# Patient Record
Sex: Female | Born: 1975 | Race: White | Hispanic: No | Marital: Married | Smoking: Never smoker
Health system: Southern US, Community
[De-identification: ages and names within clinical notes are randomized; demographics above are authoritative.]

## PROBLEM LIST (undated history)

## (undated) DIAGNOSIS — F32A Depression, unspecified: Secondary | ICD-10-CM

## (undated) DIAGNOSIS — N83209 Unspecified ovarian cyst, unspecified side: Secondary | ICD-10-CM

## (undated) DIAGNOSIS — F419 Anxiety disorder, unspecified: Secondary | ICD-10-CM

## (undated) DIAGNOSIS — D649 Anemia, unspecified: Secondary | ICD-10-CM

## (undated) HISTORY — PX: OTHER SURGICAL HISTORY: SHX169

---

## 2006-04-03 ENCOUNTER — Emergency Department: Payer: Self-pay | Admitting: Emergency Medicine

## 2006-04-03 ENCOUNTER — Other Ambulatory Visit: Payer: Self-pay

## 2006-05-27 ENCOUNTER — Inpatient Hospital Stay: Payer: Self-pay

## 2006-05-29 ENCOUNTER — Other Ambulatory Visit: Payer: Self-pay

## 2006-06-21 ENCOUNTER — Inpatient Hospital Stay: Payer: Self-pay | Admitting: Unknown Physician Specialty

## 2006-06-22 ENCOUNTER — Other Ambulatory Visit: Payer: Self-pay

## 2006-07-01 ENCOUNTER — Other Ambulatory Visit: Payer: Self-pay

## 2006-07-15 ENCOUNTER — Ambulatory Visit: Payer: Self-pay | Admitting: Unknown Physician Specialty

## 2006-07-29 ENCOUNTER — Ambulatory Visit: Payer: Self-pay | Admitting: Unknown Physician Specialty

## 2006-08-29 ENCOUNTER — Ambulatory Visit: Payer: Self-pay | Admitting: Unknown Physician Specialty

## 2006-09-27 ENCOUNTER — Ambulatory Visit: Payer: Self-pay | Admitting: Unknown Physician Specialty

## 2006-10-28 ENCOUNTER — Ambulatory Visit: Payer: Self-pay | Admitting: Unknown Physician Specialty

## 2006-11-27 ENCOUNTER — Ambulatory Visit: Payer: Self-pay | Admitting: Unknown Physician Specialty

## 2006-12-28 ENCOUNTER — Ambulatory Visit: Payer: Self-pay | Admitting: Unknown Physician Specialty

## 2007-01-27 ENCOUNTER — Ambulatory Visit: Payer: Self-pay | Admitting: Unknown Physician Specialty

## 2007-02-27 ENCOUNTER — Ambulatory Visit: Payer: Self-pay | Admitting: Unknown Physician Specialty

## 2007-03-30 ENCOUNTER — Ambulatory Visit: Payer: Self-pay | Admitting: Unknown Physician Specialty

## 2007-04-29 ENCOUNTER — Ambulatory Visit: Payer: Self-pay | Admitting: Unknown Physician Specialty

## 2007-05-30 ENCOUNTER — Ambulatory Visit: Payer: Self-pay | Admitting: Unknown Physician Specialty

## 2007-06-29 ENCOUNTER — Ambulatory Visit: Payer: Self-pay | Admitting: Unknown Physician Specialty

## 2007-07-30 ENCOUNTER — Ambulatory Visit: Payer: Self-pay | Admitting: Unknown Physician Specialty

## 2007-08-30 ENCOUNTER — Ambulatory Visit: Payer: Self-pay | Admitting: Unknown Physician Specialty

## 2007-09-29 ENCOUNTER — Ambulatory Visit: Payer: Self-pay | Admitting: Unknown Physician Specialty

## 2007-10-28 ENCOUNTER — Ambulatory Visit: Payer: Self-pay | Admitting: Unknown Physician Specialty

## 2007-11-27 ENCOUNTER — Ambulatory Visit: Payer: Self-pay | Admitting: Unknown Physician Specialty

## 2007-12-28 ENCOUNTER — Ambulatory Visit: Payer: Self-pay | Admitting: Unknown Physician Specialty

## 2008-01-27 ENCOUNTER — Ambulatory Visit: Payer: Self-pay | Admitting: Unknown Physician Specialty

## 2008-03-29 ENCOUNTER — Ambulatory Visit: Payer: Self-pay | Admitting: Unknown Physician Specialty

## 2008-06-28 ENCOUNTER — Ambulatory Visit: Payer: Self-pay | Admitting: Unknown Physician Specialty

## 2008-07-29 ENCOUNTER — Ambulatory Visit: Payer: Self-pay | Admitting: Unknown Physician Specialty

## 2008-09-26 ENCOUNTER — Ambulatory Visit: Payer: Self-pay | Admitting: Unknown Physician Specialty

## 2008-11-26 ENCOUNTER — Ambulatory Visit: Payer: Self-pay | Admitting: Unknown Physician Specialty

## 2009-02-26 ENCOUNTER — Ambulatory Visit: Payer: Self-pay | Admitting: Unknown Physician Specialty

## 2019-09-20 ENCOUNTER — Other Ambulatory Visit: Payer: Self-pay | Admitting: Internal Medicine

## 2019-09-20 DIAGNOSIS — Z1231 Encounter for screening mammogram for malignant neoplasm of breast: Secondary | ICD-10-CM

## 2019-12-13 ENCOUNTER — Ambulatory Visit
Admission: RE | Admit: 2019-12-13 | Discharge: 2019-12-13 | Disposition: A | Discharge status: Home / Self Care | Payer: BC Managed Care – PPO | Source: Ambulatory Visit | Attending: Internal Medicine | Admitting: Internal Medicine

## 2019-12-13 DIAGNOSIS — Z1231 Encounter for screening mammogram for malignant neoplasm of breast: Secondary | ICD-10-CM | POA: Diagnosis not present

## 2019-12-15 ENCOUNTER — Other Ambulatory Visit: Payer: Self-pay | Admitting: Internal Medicine

## 2019-12-15 DIAGNOSIS — N6320 Unspecified lump in the left breast, unspecified quadrant: Secondary | ICD-10-CM

## 2019-12-15 DIAGNOSIS — R928 Other abnormal and inconclusive findings on diagnostic imaging of breast: Secondary | ICD-10-CM

## 2019-12-23 ENCOUNTER — Ambulatory Visit
Admission: RE | Admit: 2019-12-23 | Discharge: 2019-12-23 | Disposition: A | Discharge status: Home / Self Care | Payer: BC Managed Care – PPO | Source: Ambulatory Visit | Attending: Internal Medicine | Admitting: Internal Medicine

## 2019-12-23 DIAGNOSIS — N6320 Unspecified lump in the left breast, unspecified quadrant: Secondary | ICD-10-CM | POA: Insufficient documentation

## 2019-12-23 DIAGNOSIS — R928 Other abnormal and inconclusive findings on diagnostic imaging of breast: Secondary | ICD-10-CM

## 2019-12-24 ENCOUNTER — Other Ambulatory Visit: Payer: Self-pay | Admitting: Internal Medicine

## 2019-12-24 DIAGNOSIS — N6320 Unspecified lump in the left breast, unspecified quadrant: Secondary | ICD-10-CM

## 2020-05-10 ENCOUNTER — Other Ambulatory Visit: Payer: BC Managed Care – PPO

## 2020-05-10 DIAGNOSIS — Z20822 Contact with and (suspected) exposure to covid-19: Secondary | ICD-10-CM

## 2020-05-11 LAB — NOVEL CORONAVIRUS, NAA: SARS-CoV-2, NAA: NOT DETECTED

## 2020-05-11 LAB — SARS-COV-2, NAA 2 DAY TAT

## 2020-09-20 ENCOUNTER — Other Ambulatory Visit: Payer: Self-pay | Admitting: Obstetrics and Gynecology

## 2020-10-19 NOTE — H&P (Signed)
Leslie Elliott is a 45 y.o. female here for pre-op sign consent for Robotically assisted Laparoscopic Ovarian Cystectomy on 10/30/20.  Referring provider: Genice Rouge*  History of Present Illness: Patient is an established patient who presents for a pre-op sign consent for a robotic-assisted laparoscopic ovarian cystectomy on 10/30/20. Patient has a hx of multiple simple and complex ovarian cysts.  In 03/2019, patient had experienced intermenstrual spotting and a few episodes of menorrhagia for which she had work up with pelvic ultrasound, see below. TVUS in 03/2019 revealed 3 simple ovarian cysts of the left ovary and 4 bilateral complex ovarian cysts.Imaging also revealed thickened endometrium, for which endometrial biopsy was done, results below.  Work up so far: Incidental finding upon AUB work up in 2020 (new onset intermenstrual spotting, menorrhagia): -EMBx:Secretory endometrium, day 20-21, post-ovulatory day 6-7. o hyperplasia, carcinoma or endometritis. -TVUS 03/2019:Utanteverted:10.45 x 5.94 x 6.51cm;Fibroids: 1)endometrial=1.5cm 2)anterior=0.79cm 3)Rt fundal=2.1cm 4)fundal=3.2cm; Endometrium:17.66mm LO:4 cysts: 1)simple=1.3cm2)complex=1.5cm; solid area within=1.01 x 0.33 x 1.03cm3)simple=1.4cm 4)simple=1.2cm RO:3 cysts: 1)complex=1.5cm 2)complex=2.3cm; septation=0.19cm 3)complex=2.6cm; solid area within=1.39 x 0.87 x 1.19cm  02/2020: CA125: normal TVUS 02/2020: Ut anteverted=10.95 x 5.89 x 6.95cm;Fibroids seen: 1)endometrial = 1.6cm         2) anterior = 1.2cm 3) rt fundal = 2.1cm 4) funda l= 3.1cm Endometrium=18.52mm;Free fluid seen in PCDS Lt ovary contains 3 cysts: 1)simple=2.6cm 2)simple=1.3cm 3)complex with solid calcified nodule within=1.8cm; nodule=1.19 x 0.45 x 1.14cm Rt ovary contains 2 cysts: 1)corpus luteum=1.5cm 2)complex with solid nodule within=3cm; nodule=1.39 x 0.78 x  1.31cm  09/14/20: Uterusanteverted=10.51 x 5.23 x 6.02cm;Endometrium=14.45mm Fibroids seen:   1) Rt lateral cervix=1.5cm 2) endometrial=1.6cm              3) anterior=1.2cm   4) Rt fundal=1.6cm   5) fundal=3.9cm Rt ovary contains 3cysts:1) simple=2.8cm 2) complex with solid nodule within = 2.7cm; nodule = 1.23 x 1.01 x 1.16cm 3) simple paraovarian = 1.6cm ROvolume = 18.76ml LOcontains 3cysts:1) simple = 2.3cm 2) simple = 1.3cm                                    3) complex with solid calcified nodule within = 1.8cm; nodule = 1.05 x 0.40   x 0.90cm No free fluid seen  Pertinent Hx: -2021 annual: periods\pretty normal now -3 SVD; s/p BTL -Mother had uterine cancer, dx at age 90 -Menses PJ:ASNKNLZ, 4-6 weeks when younger but becameq4 weeks  -Irregular bleeding episode 03/2019:  -Uterine Fibroids, Menorrhagiain8/2021 -Imaging showed 4 uterine fibroids ranging in size from 1.6cm to 3cm. Pt is           symptomatic w/ menorrhagia but not bothered -Hx of cervical polyp, bx done 2020: benign endocervical polyp  Past Medical History:  has a past medical history of Anxiety and Depression.  Past Surgical History:  has a past surgical history that includes Tubal ligation (2002). Family History: family history includes Allergies in her mother; Cancer in her mother; Heart disease in her father; Hyperlipidemia (Elevated cholesterol) in her mother. Social History:  reports that she has never smoked. She has never used smokeless tobacco. She reports current alcohol use. She reports that she does not use drugs. OB/GYN History:          OB History    Gravida  3   Para  3   Term  3   Preterm      AB      Living  3     SAB      IAB      Ectopic      Molar      Multiple      Live Births            Allergies: has No Known Allergies. Medications: Current Outpatient Medications:  .  ARIPiprazole (ABILIFY) 5 MG tablet, ,  Disp: , Rfl:  .  buPROPion (WELLBUTRIN SR) 150 MG SR tablet, , Disp: , Rfl:  .  cyanocobalamin (VITAMIN B12) 1000 MCG tablet, Take by mouth, Disp: , Rfl:  .  ferrous sulfate 325 (65 FE) MG EC tablet, Take by mouth, Disp: , Rfl:  .  ibuprofen (MOTRIN) 200 MG tablet, Take by mouth, Disp: , Rfl:  .  propranolol (INDERAL) 10 MG tablet, Take 10 mg by mouth 2 (two) times daily, Disp: , Rfl:   Review of Systems: No SOB, no palpitations or chest pain, no new lower extremity edema, no nausea or vomiting or bowel or bladder complaints. See HPI for gyn specific ROS.   Exam:      Vitals:   10/19/20 1134  BP: 103/72  Pulse: 92    Lungs: CTA  CV : RRR without murmur   Skin: No rashes, ulcers or skin lesions noted. No excessive hirsutism or acne noted.                                                  Neurological: Appears alert and oriented and is a good historian. No gross abnormalities are noted.                     Psychological: Normal affect and mood. No signs of anxiety or depression noted.                                                           Abdomen: Soft , no mass, normal active bowel sounds,  non-tender, no rebound tenderness       Pelvic:  deferred  Impression:   The encounter diagnosis was Complex cyst of both ovaries.  Plan:   Patient returns for a preoperative discussion regarding her plans to proceed with surgical treatment of her bilateral ovarian cysts by robotically assisted bilateral ovarian cystectomy procedure. The patient and I discussed the technical aspects of the procedure including the potential for risks and complications. These include but are not limited to the risk of infection requiring post-operative antibiotics or further procedures. We talked about the risk of injury to adjacent organs including bladder, bowel, ureter, blood vessels or nerves. We talked about the need to convert to an open incision. We talked about the possible need for blood  transfusion. We talked aboutpostop complications such asthromboembolic or cardiopulmonary complications. All of her questions were answered.  Her preoperative exam was completed and the appropriate consents were signed. She is scheduled to undergo this procedure in the near future.  Specific Peri-operative Considerations:  - Consent: obtained today - Health Maintenance: up to date - Labs: CBC, CMP preoperatively - Studies: EKG, CXR preoperatively - Bowel Preparation: None required - Abx:  None indicated - VTE ppx: SCDs perioperatively  Diagnoses and all orders for this visit:  Complex cyst of both ovaries     Return for Postop check.

## 2020-10-23 ENCOUNTER — Encounter
Admission: RE | Admit: 2020-10-23 | Discharge: 2020-10-23 | Disposition: A | Discharge status: Home / Self Care | Payer: BC Managed Care – PPO | Source: Ambulatory Visit | Attending: Obstetrics and Gynecology | Admitting: Obstetrics and Gynecology

## 2020-10-23 ENCOUNTER — Other Ambulatory Visit: Payer: Self-pay

## 2020-10-23 HISTORY — DX: Anemia, unspecified: D64.9

## 2020-10-23 HISTORY — DX: Unspecified ovarian cyst, unspecified side: N83.209

## 2020-10-23 HISTORY — DX: Depression, unspecified: F32.A

## 2020-10-23 HISTORY — DX: Anxiety disorder, unspecified: F41.9

## 2020-10-23 NOTE — Patient Instructions (Addendum)
Your procedure is scheduled on:  Monday, April 4 Report to the Registration Desk on the 1st floor of the [facility]. To find out your arrival time, please call [phone] between 1PM - 3PM on: Friday, April 1  REMEMBER: Instructions that are not followed completely may result in serious medical risk, up to and including death; or upon the discretion of your surgeon and anesthesiologist your surgery may need to be rescheduled.  Do not eat food after midnight the night before surgery.  No gum chewing, lozengers or hard candies.  You may however, drink CLEAR liquids up to 2 hours before you are scheduled to arrive for your surgery. Do not drink anything within 2 hours of your scheduled arrival time.  Clear liquids include: - water  - apple juice without pulp - gatorade (not RED, PURPLE, OR BLUE) - black coffee or tea (Do NOT add milk or creamers to the coffee or tea) Do NOT drink anything that is not on this list.  TAKE THESE MEDICATIONS THE MORNING OF SURGERY WITH A SIP OF WATER:   1.  bupropion  One week prior to surgery: starting today, March 28 Stop Anti-inflammatories (NSAIDS) such as Advil, Aleve, Ibuprofen, Motrin, Naproxen, Naprosyn and Aspirin based products such as Excedrin, Goodys Powder, BC Powder. Stop ANY OVER THE COUNTER supplements until after surgery.  No Alcohol for 24 hours before or after surgery.  No Smoking including e-cigarettes for 24 hours prior to surgery.  No chewable tobacco products for at least 6 hours prior to surgery.  No nicotine patches on the day of surgery.  Do not use any "recreational" drugs for at least a week prior to your surgery.  Please be advised that the combination of cocaine and anesthesia may have negative outcomes, up to and including death. If you test positive for cocaine, your surgery will be cancelled.  On the morning of surgery brush your teeth with toothpaste and water, you may rinse your mouth with mouthwash if you  wish. Do not swallow any toothpaste or mouthwash.  Do not wear jewelry, make-up, hairpins, clips or nail polish.  Do not wear lotions, powders, or perfumes.   Do not shave body from the neck down 48 hours prior to surgery just in case you cut yourself which could leave a site for infection.  Also, freshly shaved skin may become irritated if using the CHG soap.  Contact lenses, hearing aids and dentures may not be worn into surgery.  Do not bring valuables to the hospital. The [facility] Center is not responsible for any missing/lost belongings or valuables.   Use CHG Soap as directed on instruction sheet.  Notify your doctor if there is any change in your medical condition (cold, fever, infection).  Wear comfortable clothing (specific to your surgery type) to the hospital.  Plan for stool softeners for home use; pain medications have a tendency to cause constipation. You can also help prevent constipation by eating foods high in fiber such as fruits and vegetables and drinking plenty of fluids as your diet allows.  After surgery, you can help prevent lung complications by doing breathing exercises.  Take deep breaths and cough every 1-2 hours. Your doctor may order a device called an Incentive Spirometer to help you take deep breaths. When coughing or sneezing, hold a pillow firmly against your incision with both hands. This is called "splinting." Doing this helps protect your incision. It also decreases belly discomfort.  If you are being discharged the day of surgery,  you will not be allowed to drive home. You will need a responsible adult (18 years or older) to drive you home and stay with you that night.   If you are taking public transportation, you will need to have a responsible adult (18 years or older) with you. Please confirm with your physician that it is acceptable to use public transportation.   Please call the [facility] at [phone] if you have any  questions about these instructions.  Surgery Visitation Policy:  Patients undergoing a surgery or procedure may have one family member or support person with them as long as that person is not COVID-19 positive or experiencing its symptoms.  That person may remain in the waiting area during the procedure.

## 2020-10-26 ENCOUNTER — Other Ambulatory Visit: Payer: BC Managed Care – PPO

## 2020-10-26 ENCOUNTER — Other Ambulatory Visit: Payer: Self-pay

## 2020-10-26 ENCOUNTER — Other Ambulatory Visit
Admission: RE | Admit: 2020-10-26 | Discharge: 2020-10-26 | Disposition: A | Discharge status: Home / Self Care | Payer: BC Managed Care – PPO | Source: Ambulatory Visit | Attending: Obstetrics and Gynecology | Admitting: Obstetrics and Gynecology

## 2020-10-26 DIAGNOSIS — Z01812 Encounter for preprocedural laboratory examination: Secondary | ICD-10-CM | POA: Diagnosis present

## 2020-10-26 DIAGNOSIS — Z20822 Contact with and (suspected) exposure to covid-19: Secondary | ICD-10-CM | POA: Diagnosis not present

## 2020-10-26 LAB — BASIC METABOLIC PANEL
Anion gap: 10 (ref 5–15)
BUN: 11 mg/dL (ref 6–20)
CO2: 25 mmol/L (ref 22–32)
Calcium: 8.9 mg/dL (ref 8.9–10.3)
Chloride: 104 mmol/L (ref 98–111)
Creatinine, Ser: 0.88 mg/dL (ref 0.44–1.00)
GFR, Estimated: >60 mL/min (ref >60)
Glucose, Bld: 96 mg/dL (ref 70–99)
Potassium: 4.1 mmol/L (ref 3.5–5.1)
Sodium: 139 mmol/L (ref 135–145)

## 2020-10-26 LAB — CBC
HCT: 37.1 % (ref 36.0–46.0)
Hemoglobin: 11.9 g/dL — ABNORMAL LOW (ref 12.0–15.0)
MCH: 26.9 pg (ref 26.0–34.0)
MCHC: 32.1 g/dL (ref 30.0–36.0)
MCV: 83.7 fL (ref 80.0–100.0)
Platelets: 346 10*3/uL (ref 150–400)
RBC: 4.43 MIL/uL (ref 3.87–5.11)
RDW: 19.7 % — ABNORMAL HIGH (ref 11.5–15.5)
WBC: 8.6 10*3/uL (ref 4.0–10.5)
nRBC: 0 % (ref 0.0–0.2)

## 2020-10-26 LAB — TYPE AND SCREEN
ABO/RH(D): B NEG
Antibody Screen: NEGATIVE

## 2020-10-26 LAB — SARS CORONAVIRUS 2 (TAT 6-24 HRS): SARS Coronavirus 2: NEGATIVE

## 2020-10-29 MED ORDER — ORAL CARE MOUTH RINSE: 15.0000 mL | Freq: Once | OROMUCOSAL | Status: AC

## 2020-10-29 MED ORDER — LACTATED RINGERS IV SOLN: INTRAVENOUS | Status: DC

## 2020-10-29 MED ORDER — FAMOTIDINE 20 MG PO TABS: 20.0000 mg | ORAL_TABLET | Freq: Once | ORAL | Status: AC

## 2020-10-29 MED ORDER — CHLORHEXIDINE GLUCONATE 0.12 % MT SOLN: 15.0000 mL | Freq: Once | OROMUCOSAL | Status: AC

## 2020-10-30 ENCOUNTER — Ambulatory Visit: Payer: BC Managed Care – PPO | Admitting: Anesthesiology

## 2020-10-30 ENCOUNTER — Other Ambulatory Visit: Payer: Self-pay

## 2020-10-30 ENCOUNTER — Ambulatory Visit
Admission: RE | Admit: 2020-10-30 | Discharge: 2020-10-30 | Disposition: A | Discharge status: Home / Self Care | Payer: BC Managed Care – PPO | Attending: Obstetrics and Gynecology | Admitting: Obstetrics and Gynecology

## 2020-10-30 ENCOUNTER — Encounter: Payer: Self-pay | Admitting: Obstetrics and Gynecology

## 2020-10-30 ENCOUNTER — Encounter
Admission: RE | Disposition: A | Discharge status: Home / Self Care | Payer: Self-pay | Source: Home / Self Care | Attending: Obstetrics and Gynecology

## 2020-10-30 DIAGNOSIS — D270 Benign neoplasm of right ovary: Secondary | ICD-10-CM | POA: Diagnosis not present

## 2020-10-30 DIAGNOSIS — Z79899 Other long term (current) drug therapy: Secondary | ICD-10-CM | POA: Diagnosis not present

## 2020-10-30 DIAGNOSIS — N8311 Corpus luteum cyst of right ovary: Secondary | ICD-10-CM | POA: Insufficient documentation

## 2020-10-30 HISTORY — PX: ROBOTIC ASSISTED BILATERAL SALPINGO OOPHERECTOMY: SHX6078

## 2020-10-30 HISTORY — PX: XI ROBOTIC ASSISTED SALPINGECTOMY: SHX6824

## 2020-10-30 HISTORY — PX: ROBOTIC ASSISTED LAPAROSCOPIC OVARIAN CYSTECTOMY: SHX6081

## 2020-10-30 LAB — POCT PREGNANCY, URINE: Preg Test, Ur: NEGATIVE

## 2020-10-30 LAB — ABO/RH: ABO/RH(D): B NEG

## 2020-10-30 SURGERY — EXCISION, CYST, OVARY, ROBOT-ASSISTED, LAPAROSCOPIC
Anesthesia: General | Laterality: Right

## 2020-10-30 MED ORDER — GLYCOPYRROLATE 0.2 MG/ML IJ SOLN
INTRAMUSCULAR | Status: AC
  Filled 2020-10-30: qty 1

## 2020-10-30 MED ORDER — OXYCODONE HCL 5 MG PO TABS
5.0000 mg | ORAL_TABLET | Freq: Once | ORAL | Status: AC
  Administered 2020-10-30: 5 mg via ORAL

## 2020-10-30 MED ORDER — LACTATED RINGERS IV SOLN: INTRAVENOUS | Status: DC | PRN

## 2020-10-30 MED ORDER — KETOROLAC TROMETHAMINE 30 MG/ML IJ SOLN
INTRAMUSCULAR | Status: DC | PRN
  Administered 2020-10-30: 30 mg via INTRAVENOUS

## 2020-10-30 MED ORDER — ONDANSETRON HCL 4 MG/2ML IJ SOLN
INTRAMUSCULAR | Status: AC
  Filled 2020-10-30: qty 4

## 2020-10-30 MED ORDER — KETOROLAC TROMETHAMINE 30 MG/ML IJ SOLN
INTRAMUSCULAR | Status: AC
  Filled 2020-10-30: qty 1

## 2020-10-30 MED ORDER — SODIUM CHLORIDE 0.9 % IV SOLN
INTRAVENOUS | Status: DC | PRN
  Administered 2020-10-30: 25 ug/min via INTRAVENOUS

## 2020-10-30 MED ORDER — OXYCODONE HCL 5 MG PO TABS: 5.0000 mg | ORAL_TABLET | ORAL | 0 refills | Status: DC | PRN

## 2020-10-30 MED ORDER — BUPIVACAINE HCL (PF) 0.5 % IJ SOLN
INTRAMUSCULAR | Status: AC
  Filled 2020-10-30: qty 30

## 2020-10-30 MED ORDER — MIDAZOLAM HCL 2 MG/2ML IJ SOLN
INTRAMUSCULAR | Status: DC | PRN
  Administered 2020-10-30: 2 mg via INTRAVENOUS

## 2020-10-30 MED ORDER — DEXAMETHASONE SODIUM PHOSPHATE 10 MG/ML IJ SOLN
INTRAMUSCULAR | Status: AC
  Filled 2020-10-30: qty 1

## 2020-10-30 MED ORDER — ACETAMINOPHEN 10 MG/ML IV SOLN
INTRAVENOUS | Status: DC | PRN
  Administered 2020-10-30: 1000 mg via INTRAVENOUS

## 2020-10-30 MED ORDER — DEXMEDETOMIDINE (PRECEDEX) IN NS 20 MCG/5ML (4 MCG/ML) IV SYRINGE
PREFILLED_SYRINGE | INTRAVENOUS | Status: AC
  Filled 2020-10-30: qty 5

## 2020-10-30 MED ORDER — ONDANSETRON HCL 4 MG/2ML IJ SOLN
INTRAMUSCULAR | Status: DC | PRN
  Administered 2020-10-30 (×2): 4 mg via INTRAVENOUS

## 2020-10-30 MED ORDER — ONDANSETRON HCL 4 MG/2ML IJ SOLN
INTRAMUSCULAR | Status: AC
  Filled 2020-10-30: qty 2

## 2020-10-30 MED ORDER — LIDOCAINE HCL (PF) 2 % IJ SOLN
INTRAMUSCULAR | Status: AC
  Filled 2020-10-30: qty 5

## 2020-10-30 MED ORDER — ACETAMINOPHEN 500 MG PO TABS: 1000.0000 mg | ORAL_TABLET | Freq: Four times a day (QID) | ORAL | 0 refills | Status: AC

## 2020-10-30 MED ORDER — PROPOFOL 10 MG/ML IV BOLUS
INTRAVENOUS | Status: AC
  Filled 2020-10-30: qty 20

## 2020-10-30 MED ORDER — BUPIVACAINE HCL (PF) 0.5 % IJ SOLN
INTRAMUSCULAR | Status: DC | PRN
  Administered 2020-10-30: 9 mL

## 2020-10-30 MED ORDER — PHENYLEPHRINE HCL (PRESSORS) 10 MG/ML IV SOLN
INTRAVENOUS | Status: DC | PRN
  Administered 2020-10-30 (×5): 200 ug via INTRAVENOUS

## 2020-10-30 MED ORDER — KETAMINE HCL 10 MG/ML IJ SOLN
INTRAMUSCULAR | Status: DC | PRN
  Administered 2020-10-30: 20 mg via INTRAVENOUS

## 2020-10-30 MED ORDER — HYDROMORPHONE HCL 1 MG/ML IJ SOLN
INTRAMUSCULAR | Status: DC | PRN
  Administered 2020-10-30 (×2): .5 mg via INTRAVENOUS

## 2020-10-30 MED ORDER — FENTANYL CITRATE (PF) 100 MCG/2ML IJ SOLN
INTRAMUSCULAR | Status: AC
  Filled 2020-10-30: qty 2

## 2020-10-30 MED ORDER — DOCUSATE SODIUM 100 MG PO CAPS: 100.0000 mg | ORAL_CAPSULE | Freq: Two times a day (BID) | ORAL | 0 refills | Status: DC

## 2020-10-30 MED ORDER — IBUPROFEN 800 MG PO TABS: 800.0000 mg | ORAL_TABLET | Freq: Three times a day (TID) | ORAL | 1 refills | Status: AC

## 2020-10-30 MED ORDER — POVIDONE-IODINE 10 % EX SWAB: 2.0000 "application " | Freq: Once | CUTANEOUS | Status: DC

## 2020-10-30 MED ORDER — DEXMEDETOMIDINE (PRECEDEX) IN NS 20 MCG/5ML (4 MCG/ML) IV SYRINGE
PREFILLED_SYRINGE | INTRAVENOUS | Status: DC | PRN
  Administered 2020-10-30: 12 ug via INTRAVENOUS
  Administered 2020-10-30: 8 ug via INTRAVENOUS

## 2020-10-30 MED ORDER — LIDOCAINE HCL (CARDIAC) PF 100 MG/5ML IV SOSY
PREFILLED_SYRINGE | INTRAVENOUS | Status: DC | PRN
  Administered 2020-10-30: 100 mg via INTRAVENOUS

## 2020-10-30 MED ORDER — ONDANSETRON HCL 4 MG/2ML IJ SOLN
4.0000 mg | Freq: Once | INTRAMUSCULAR | Status: AC | PRN
  Administered 2020-10-30: 4 mg via INTRAVENOUS

## 2020-10-30 MED ORDER — FENTANYL CITRATE (PF) 100 MCG/2ML IJ SOLN
INTRAMUSCULAR | Status: DC | PRN
  Administered 2020-10-30: 50 ug via INTRAVENOUS

## 2020-10-30 MED ORDER — PROPOFOL 10 MG/ML IV BOLUS
INTRAVENOUS | Status: DC | PRN
  Administered 2020-10-30: 150 mg via INTRAVENOUS

## 2020-10-30 MED ORDER — GABAPENTIN 800 MG PO TABS: 800.0000 mg | ORAL_TABLET | Freq: Every day | ORAL | 0 refills | Status: DC

## 2020-10-30 MED ORDER — ACETAMINOPHEN 10 MG/ML IV SOLN
INTRAVENOUS | Status: AC
  Filled 2020-10-30: qty 100

## 2020-10-30 MED ORDER — MIDAZOLAM HCL 2 MG/2ML IJ SOLN
INTRAMUSCULAR | Status: AC
  Filled 2020-10-30: qty 2

## 2020-10-30 MED ORDER — CHLORHEXIDINE GLUCONATE 0.12 % MT SOLN
OROMUCOSAL | Status: AC
  Administered 2020-10-30: 15 mL via OROMUCOSAL
  Filled 2020-10-30: qty 15

## 2020-10-30 MED ORDER — SUGAMMADEX SODIUM 200 MG/2ML IV SOLN
INTRAVENOUS | Status: DC | PRN
  Administered 2020-10-30: 200 mg via INTRAVENOUS

## 2020-10-30 MED ORDER — ROCURONIUM BROMIDE 100 MG/10ML IV SOLN
INTRAVENOUS | Status: DC | PRN
  Administered 2020-10-30: 20 mg via INTRAVENOUS
  Administered 2020-10-30: 50 mg via INTRAVENOUS

## 2020-10-30 MED ORDER — FAMOTIDINE 20 MG PO TABS
ORAL_TABLET | ORAL | Status: AC
  Administered 2020-10-30: 20 mg via ORAL
  Filled 2020-10-30: qty 1

## 2020-10-30 MED ORDER — GLYCOPYRROLATE 0.2 MG/ML IJ SOLN
INTRAMUSCULAR | Status: DC | PRN
  Administered 2020-10-30: .2 mg via INTRAVENOUS

## 2020-10-30 MED ORDER — OXYCODONE HCL 5 MG PO TABS
ORAL_TABLET | ORAL | Status: AC
  Filled 2020-10-30: qty 1

## 2020-10-30 MED ORDER — DEXAMETHASONE SODIUM PHOSPHATE 10 MG/ML IJ SOLN
INTRAMUSCULAR | Status: DC | PRN
  Administered 2020-10-30: 10 mg via INTRAVENOUS

## 2020-10-30 MED ORDER — ROCURONIUM BROMIDE 10 MG/ML (PF) SYRINGE
PREFILLED_SYRINGE | INTRAVENOUS | Status: AC
  Filled 2020-10-30: qty 10

## 2020-10-30 MED ORDER — HYDROMORPHONE HCL 1 MG/ML IJ SOLN
INTRAMUSCULAR | Status: AC
  Filled 2020-10-30: qty 1

## 2020-10-30 MED ORDER — KETAMINE HCL 50 MG/5ML IJ SOSY
PREFILLED_SYRINGE | INTRAMUSCULAR | Status: AC
  Filled 2020-10-30: qty 5

## 2020-10-30 MED ORDER — FENTANYL CITRATE (PF) 100 MCG/2ML IJ SOLN: 25.0000 ug | INTRAMUSCULAR | Status: DC | PRN

## 2020-10-30 SURGICAL SUPPLY — 67 items
ADH SKN CLS APL DERMABOND .7 (GAUZE/BANDAGES/DRESSINGS) ×3
APL PRP STRL LF DISP 70% ISPRP (MISCELLANEOUS) ×3
BAG DRN RND TRDRP ANRFLXCHMBR (UROLOGICAL SUPPLIES) ×3
BAG SPEC RTRVL LRG 6X4 10 (ENDOMECHANICALS) ×6
BAG URINE DRAIN 2000ML AR STRL (UROLOGICAL SUPPLIES) ×4 IMPLANT
BLADE SURG SZ11 CARB STEEL (BLADE) ×4 IMPLANT
CANISTER SUCT 1200ML W/VALVE (MISCELLANEOUS) ×4 IMPLANT
CATH FOLEY 2WAY  5CC 16FR (CATHETERS) ×1
CATH FOLEY 2WAY 5CC 16FR (CATHETERS) ×3
CATH URTH 16FR FL 2W BLN LF (CATHETERS) ×3 IMPLANT
CHLORAPREP W/TINT 26 (MISCELLANEOUS) ×4 IMPLANT
COVER TIP SHEARS 8 DVNC (MISCELLANEOUS) ×3 IMPLANT
COVER TIP SHEARS 8MM DA VINCI (MISCELLANEOUS) ×1
COVER WAND RF STERILE (DRAPES) ×4 IMPLANT
DEFOGGER SCOPE WARMER CLEARIFY (MISCELLANEOUS) ×4 IMPLANT
DERMABOND ADVANCED (GAUZE/BANDAGES/DRESSINGS) ×1
DERMABOND ADVANCED .7 DNX12 (GAUZE/BANDAGES/DRESSINGS) ×3 IMPLANT
DRAPE 3/4 80X56 (DRAPES) ×8 IMPLANT
DRAPE ARM DVNC X/XI (DISPOSABLE) ×12 IMPLANT
DRAPE COLUMN DVNC XI (DISPOSABLE) ×3 IMPLANT
DRAPE DA VINCI XI ARM (DISPOSABLE) ×4
DRAPE DA VINCI XI COLUMN (DISPOSABLE) ×1
ELECT REM PT RETURN 9FT ADLT (ELECTROSURGICAL) ×4
ELECTRODE REM PT RTRN 9FT ADLT (ELECTROSURGICAL) ×3 IMPLANT
GLOVE SURG ENC MOIS LTX SZ7 (GLOVE) ×16 IMPLANT
GLOVE SURG UNDER LTX SZ7.5 (GLOVE) ×16 IMPLANT
GOWN STRL REUS W/ TWL LRG LVL3 (GOWN DISPOSABLE) ×24 IMPLANT
GOWN STRL REUS W/TWL LRG LVL3 (GOWN DISPOSABLE) ×32
GRASPER SUT TROCAR 14GX15 (MISCELLANEOUS) ×4 IMPLANT
IRRIGATION STRYKERFLOW (MISCELLANEOUS) ×3 IMPLANT
IRRIGATOR STRYKERFLOW (MISCELLANEOUS) ×4
IV NS 1000ML (IV SOLUTION)
IV NS 1000ML BAXH (IV SOLUTION) IMPLANT
KIT PINK PAD W/HEAD ARE REST (MISCELLANEOUS) ×4
KIT PINK PAD W/HEAD ARM REST (MISCELLANEOUS) ×3 IMPLANT
KIT TURNOVER CYSTO (KITS) ×4 IMPLANT
LABEL OR SOLS (LABEL) ×4 IMPLANT
MANIFOLD NEPTUNE II (INSTRUMENTS) ×4 IMPLANT
MANIPULATOR VCARE LG CRV RETR (MISCELLANEOUS) IMPLANT
MANIPULATOR VCARE SML CRV RETR (MISCELLANEOUS) IMPLANT
MANIPULATOR VCARE STD CRV RETR (MISCELLANEOUS) IMPLANT
NS IRRIG 1000ML POUR BTL (IV SOLUTION) ×4 IMPLANT
OBTURATOR OPTICAL STANDARD 8MM (TROCAR) ×1
OBTURATOR OPTICAL STND 8 DVNC (TROCAR) ×3
OBTURATOR OPTICALSTD 8 DVNC (TROCAR) ×3 IMPLANT
OCCLUDER COLPOPNEUMO (BALLOONS) IMPLANT
PACK GYN LAPAROSCOPIC (MISCELLANEOUS) ×4 IMPLANT
PAD OB MATERNITY 4.3X12.25 (PERSONAL CARE ITEMS) ×4 IMPLANT
PAD PREP 24X41 OB/GYN DISP (PERSONAL CARE ITEMS) ×4 IMPLANT
PORT ACCESS TROCAR AIRSEAL 5 (TROCAR) IMPLANT
POUCH SPECIMEN RETRIEVAL 10MM (ENDOMECHANICALS) ×8 IMPLANT
SCISSORS METZENBAUM CVD 33 (INSTRUMENTS) IMPLANT
SEAL CANN UNIV 5-8 DVNC XI (MISCELLANEOUS) ×12 IMPLANT
SEAL XI 5MM-8MM UNIVERSAL (MISCELLANEOUS) ×4
SEALER VESSEL DA VINCI XI (MISCELLANEOUS)
SEALER VESSEL EXT DVNC XI (MISCELLANEOUS) IMPLANT
SET CYSTO W/LG BORE CLAMP LF (SET/KITS/TRAYS/PACK) IMPLANT
SET TRI-LUMEN FLTR TB AIRSEAL (TUBING) IMPLANT
SOLUTION ELECTROLUBE (MISCELLANEOUS) ×4 IMPLANT
SURGILUBE 2OZ TUBE FLIPTOP (MISCELLANEOUS) ×4 IMPLANT
SUT MNCRL 4-0 (SUTURE) ×8
SUT MNCRL 4-0 27XMFL (SUTURE) ×6
SUT VIC AB 0 CT2 27 (SUTURE) ×4 IMPLANT
SUT VLOC 90 2/L VL 12 GS22 (SUTURE) IMPLANT
SUTURE MNCRL 4-0 27XMF (SUTURE) ×6 IMPLANT
TROCAR XCEL NON-BLD 11X100MML (ENDOMECHANICALS) ×4 IMPLANT
TROCAR XCEL NON-BLD 5MMX100MML (ENDOMECHANICALS) ×4 IMPLANT

## 2020-10-30 NOTE — Anesthesia Preprocedure Evaluation (Signed)
Anesthesia Evaluation  Patient identified by MRN, date of birth, ID band Patient awake    Reviewed: Allergy & Precautions, NPO status , Patient's Chart, lab work & pertinent test results  History of Anesthesia Complications Negative for: history of anesthetic complications  Airway Mallampati: III       Dental   Pulmonary neg sleep apnea, neg COPD, Not current smoker,           Cardiovascular (-) hypertension(-) Past MI and (-) CHF (-) dysrhythmias (-) Valvular Problems/Murmurs     Neuro/Psych neg Seizures Anxiety Depression    GI/Hepatic Neg liver ROS, neg GERD  ,  Endo/Other  neg diabetes  Renal/GU negative Renal ROS     Musculoskeletal   Abdominal   Peds  Hematology  (+) anemia ,   Anesthesia Other Findings   Reproductive/Obstetrics                             Anesthesia Physical Anesthesia Plan  ASA: II  Anesthesia Plan: General   Post-op Pain Management:    Induction: Intravenous  PONV Risk Score and Plan: 3  Airway Management Planned: Oral ETT  Additional Equipment:   Intra-op Plan:   Post-operative Plan:   Informed Consent: I have reviewed the patients History and Physical, chart, labs and discussed the procedure including the risks, benefits and alternatives for the proposed anesthesia with the patient or authorized representative who has indicated his/her understanding and acceptance.       Plan Discussed with:   Anesthesia Plan Comments:         Anesthesia Quick Evaluation

## 2020-10-30 NOTE — Op Note (Signed)
[facility] PROCEDURE DATE: 10/30/2020  PREOPERATIVE DIAGNOSIS:  POSTOPERATIVE DIAGNOSIS: The same PROCEDURE:  XI ROBOTIC ASSISTED LAPAROSCOPIC OVARIAN CYSTECTOMY:  XI ROBOTIC ASSISTED SALPINGECTOMY:  XI ROBOTIC ASSISTED SALPINGO OOPHORECTOMY:   SURGEON:  Dr. Benjaman Kindler, MD ASSISTANT: CST Anesthesiologist:  Anesthesiologist: Gunnar Fusi, MD CRNA: Tollie Eth, CRNA; Fletcher-Harrison, Administrator, arts, CRNA  INDICATIONS: 45 y.o. F with persistent complex ovarian cysts bilaterally, here for definitive surgical management secondary to the indications listed under preoperative diagnoses; please see preoperative note for further details.  Risks of surgery were discussed with the patient including but not limited to: bleeding which may require transfusion or reoperation; infection which may require antibiotics; injury to bowel, bladder, ureters or other surrounding organs; need for additional procedures; thromboembolic phenomenon, incisional problems and other postoperative/anesthesia complications. Written informed consent was obtained.    FINDINGS:   Pelvic: External genitalia negative for lesions. Vagina negative. Adnexa negative for masses or nodularity. Cervix without gross lesions. Uterus mobile, anteverted, small, with fibroids  Intraoperative findings revealed a normal upper abdomen including bowel, diaphragmatic surfaces, stomach, and omentum.  The uterus was small and mobile, but with multiple small subserosal fibroids  Bilateral tubes with simple paratubal cysts, disrupted from prior BTL Right tube with large cyst.  Bilateral ovaries with cysts. Right ovary with cyst removed, left ovary with vesicles and no clear ovarian cyst. The decision to remove the left ovary was made, as if malignancy is present, she will need a second surgery with gyn oncology anyway, and my risk of upstaging her was higher since I didn't see a particular cyst to focus on. For the same reason, I  removed both fimbriae, as they were attached to the ovaries.  ANESTHESIA:    General INTRAVENOUS FLUIDS:2000  ml ESTIMATED BLOOD LOSS:50 ml URINE OUTPUT: 1800 ml  SPECIMENS: Right ovarian cyst, right fallopian tube. Left fallopian tube and left ovary. Pelvic washings.  COMPLICATIONS: None immediate  PROCEDURE IN DETAIL: After informed consent was obtained, the patient was taken to the operating room where general anesthesia was obtained without difficulty. The patient was positioned in the dorsal lithotomy position in [place] and her arms were carefully tucked at her sides and the usual precautions were taken. Deep Trendelenburg (20-25 deg) was established to confirm that she does not shift on the table.  She was prepped and draped in normal sterile fashion.  Time-out was performed and a Foley catheter was placed into the bladder. An acorn uterine manipulator was then placed in the uterus without incident.   After infiltration of local anesthetic at the proposed trocar sites, an 8 mm incision was created at the umbilicus, and a robot trocar was placed under direct visualization. Pneumoperitoneum was created to a pressure of 15 mm Hg. The camera was placed and the abdomin surveyed, noting intact bowel below the site of entry. A survey of the pelvis and upper abdomen revealed the above findings. Right and left lateral 8-mm robotic ports were placed under direct visualization. 12-mm assistant port was placed on the right.  The patient was placed in deepTrendelenburg and the bowel was displaced up into the upper abdomen. The robot was left side docked. The instruments were placed under direct visualization.   The ureters were identified bilaterally coursing outside of the operative field. Pelvic washings were taken.  The Fallopian tubes were divided from the ovaries, and care taken to hemostatically transect the utero-ovarian ligament on the left with bipolar cautery and monopolar scissors.  The right ovarian cyst was  removed intact over a bag, and the right tube was placed in the same bag.  The same was done in a separate bag on the left, and the left ovary and tube were removed.  Hemostasis was secured with suction-irrigation.The intraperitoneal pressure was dropped, and all planes of dissection, vascular pedicles were found to be hemostatic.  The robot was undocked. The 12-mm fascia was closed with 2 figure of 8 stiches with the cone.  The lateral trocars were removed under visualization.  The CO2 gas was released and several deep breaths given to remove any remaining CO2 from the peritoneal cavity.  The skin incisions were closed with 4-0 Monocryl subcuticular stitch and pressure dressings placed.    Anesthesia was reversed without difficulty.  The patient tolerated the procedure well.  Sponge, lap and needle counts were correct x2.  The patient was taken to recovery room in excellent condition.

## 2020-10-30 NOTE — Discharge Instructions (Addendum)
Laparoscopic Ovarian Surgery Discharge Instructions  For the next three days, take ibuprofen and acetaminophen on a schedule, every 8 hours. You can take them together or you can intersperse them, and take one every four hours. I also gave you gabapentin for nighttime, to help you sleep and also to control pain. Take gabapentin medicines at night for at least the next 3 nights. You also have a narcotic, oxycodone, to take as needed if the above medicines don't help.  Postop constipation is a major cause of pain. Stay well hydrated, walk as you tolerate, and take over the counter senna as well as stool softeners if you need them.   RISKS AND COMPLICATIONS   Infection.  Bleeding.  Injury to surrounding organs.  Anesthetic side effects.   PROCEDURE   You may be given a medicine to help you relax (sedative) before the procedure. You will be given a medicine to make you sleep (general anesthetic) during the procedure.  A tube will be put down your throat to help your breath while under general anesthesia.  Several small cuts (incisions) are made in the lower abdominal area and one incision is made near the belly button.  Your abdominal area will be inflated with a safe gas (carbon dioxide). This helps give the surgeon room to operate, visualize, and helps the surgeon avoid other organs.  A thin, lighted tube (laparoscope) with a camera attached is inserted into your abdomen through the incision near the belly button. Other small instruments may also be inserted through other abdominal incisions.  The ovary is located and are removed.  After the ovary is removed, the gas is released from the abdomen.  The incisions will be closed with stitches (sutures), and Dermabond. A bandage may be placed over the incisions.  AFTER THE PROCEDURE   You will also have some mild abdominal discomfort for 3-7 days. You will be given pain medicine to ease any discomfort.  As long as there are no  problems, you may be allowed to go home. Someone will need to drive you home and be with you for at least 24 hours once home.  You may have some mild discomfort in the throat. This is from the tube placed in your throat while you were sleeping.  You may experience discomfort in the shoulder area from some trapped air between the liver and diaphragm. This sensation is normal and will slowly go away on its own.  HOME CARE INSTRUCTIONS   Take all medicines as directed.  Only take over-the-counter or prescription medicines for pain, discomfort, or fever as directed by your caregiver.  Resume daily activities as directed.  Showers are preferred over baths for 2 weeks.  You may resume sexual activities in 1 week or as you feel you would like to.  Do not drive while taking narcotics.  SEEK MEDICAL CARE IF: .  There is increasing abdominal pain.  You feel lightheaded or faint.  You have the chills.  You have an oral temperature above 102 F (38.9 C).  There is pus-like (purulent) drainage from any of the wounds.  You are unable to pass gas or have a bowel movement.  You feel sick to your stomach (nauseous) or throw up (vomit) and can't control it with your medicines.  MAKE SURE YOU:   Understand these instructions.  Will watch your condition.  Will get help right away if you are not doing well or get worse.  ExitCare Patient Information 2013 [place].  AMBULATORY SURGERY  DISCHARGE INSTRUCTIONS   1) The drugs that you were given will stay in your system until tomorrow so for the next 24 hours you should not:  A) Drive an automobile B) Make any legal decisions C) Drink any alcoholic beverage   2) You may resume regular meals tomorrow.  Today it is better to start with liquids and gradually work up to solid foods.  You may eat anything you prefer, but it is better to start with liquids, then soup and crackers, and gradually work up to solid  foods.   3) Please notify your doctor immediately if you have any unusual bleeding, trouble breathing, redness and pain at the surgery site, drainage, fever, or pain not relieved by medication.    4) Additional Instructions:        Please contact your physician with any problems or Same Day Surgery at [phone], Monday through Friday 6 am to 4 pm, or [place] at [facility] number at [phone].

## 2020-10-30 NOTE — Anesthesia Postprocedure Evaluation (Signed)
Anesthesia Post Note  Patient: Leslie Elliott  Procedure(s) Performed: XI ROBOTIC ASSISTED LAPAROSCOPIC OVARIAN CYSTECTOMY (Bilateral ) XI ROBOTIC ASSISTED SALPINGECTOMY (Right ) XI ROBOTIC ASSISTED SALPINGO OOPHORECTOMY (Left )  Patient location during evaluation: PACU Anesthesia Type: General Level of consciousness: awake and alert Pain management: pain level controlled Vital Signs Assessment: post-procedure vital signs reviewed and stable Respiratory status: spontaneous breathing and respiratory function stable Cardiovascular status: stable Anesthetic complications: no   No complications documented.   Last Vitals:  Vitals:   10/30/20 1001 10/30/20 1030  BP: 139/68 133/81  Pulse: 68 (!) 58  Resp: 10 12  Temp: 36.7 C   SpO2: 100% 96%    Last Pain:  Vitals:   10/30/20 1030  TempSrc:   PainSc: 0-No pain                 Shakendra Griffeth K

## 2020-10-30 NOTE — Transfer of Care (Signed)
Immediate Anesthesia Transfer of Care Note  Patient: LATICA HOHMANN  Procedure(s) Performed: XI ROBOTIC ASSISTED LAPAROSCOPIC OVARIAN CYSTECTOMY (Bilateral ) XI ROBOTIC ASSISTED SALPINGECTOMY (Right ) XI ROBOTIC ASSISTED SALPINGO OOPHORECTOMY (Left )  Patient Location: PACU  Anesthesia Type:General  Level of Consciousness: drowsy, patient cooperative and responds to stimulation  Airway & Oxygen Therapy: Patient Spontanous Breathing and Patient connected to face mask oxygen  Post-op Assessment: Report given to RN and Post -op Vital signs reviewed and stable  Post vital signs: Reviewed and stable  Last Vitals:  Vitals Value Taken Time  BP    Temp    Pulse 59 10/30/20 1007  Resp 15 10/30/20 1007  SpO2 95 % 10/30/20 1007  Vitals shown include unvalidated device data.  Last Pain:  Vitals:   10/30/20 1001  TempSrc:   PainSc: Asleep      Patients Stated Pain Goal: 0 (94/07/68 0881)  Complications: No complications documented.

## 2020-10-30 NOTE — Interval H&P Note (Signed)
History and Physical Interval Note:  10/30/2020 7:32 AM  Leslie Elliott  has presented today for surgery, with the diagnosis of complex ovarian cysts.  The various methods of treatment have been discussed with the patient and family. After consideration of risks, benefits and other options for treatment, the patient has consented to  Procedure(s): XI ROBOTIC [place] (Bilateral) as a surgical intervention.  The patient's history has been reviewed, patient examined, no change in status, stable for surgery.  I have reviewed the patient's chart and labs.  Questions were answered to the patient's satisfaction.     Benjaman Kindler

## 2020-10-30 NOTE — Anesthesia Procedure Notes (Signed)
Procedure Name: Intubation Performed by: Fletcher-Harrison, Larken Urias, CRNA Pre-anesthesia Checklist: Patient identified, Emergency Drugs available, Suction available and Patient being monitored Patient Re-evaluated:Patient Re-evaluated prior to induction Oxygen Delivery Method: Circle system utilized Preoxygenation: Pre-oxygenation with 100% oxygen Induction Type: IV induction Ventilation: Mask ventilation without difficulty Laryngoscope Size: McGraph and 3 Grade View: Grade I Tube type: Oral Tube size: 6.5 mm Number of attempts: 1 Airway Equipment and Method: Stylet and Oral airway Placement Confirmation: ETT inserted through vocal cords under direct vision,  positive ETCO2,  breath sounds checked- equal and bilateral and CO2 detector Secured at: 21 cm Tube secured with: Tape Dental Injury: Teeth and Oropharynx as per pre-operative assessment        

## 2020-10-31 ENCOUNTER — Encounter: Payer: Self-pay | Admitting: Obstetrics and Gynecology

## 2020-10-31 LAB — CYTOLOGY - NON PAP

## 2020-11-15 LAB — SURGICAL PATHOLOGY

## 2020-12-12 ENCOUNTER — Other Ambulatory Visit: Payer: Self-pay | Admitting: Obstetrics and Gynecology

## 2020-12-12 DIAGNOSIS — N6002 Solitary cyst of left breast: Secondary | ICD-10-CM

## 2020-12-18 ENCOUNTER — Ambulatory Visit
Admission: RE | Admit: 2020-12-18 | Discharge: 2020-12-18 | Disposition: A | Discharge status: Home / Self Care | Payer: BC Managed Care – PPO | Source: Ambulatory Visit | Attending: Obstetrics and Gynecology | Admitting: Obstetrics and Gynecology

## 2020-12-18 ENCOUNTER — Other Ambulatory Visit: Payer: Self-pay

## 2020-12-18 DIAGNOSIS — N6002 Solitary cyst of left breast: Secondary | ICD-10-CM | POA: Insufficient documentation

## 2021-03-25 ENCOUNTER — Other Ambulatory Visit: Payer: Self-pay

## 2021-03-25 ENCOUNTER — Emergency Department (EMERGENCY_DEPARTMENT_HOSPITAL)
Admission: EM | Admit: 2021-03-25 | Discharge: 2021-03-26 | Disposition: A | Discharge status: Home / Self Care | Payer: BC Managed Care – PPO | Source: Home / Self Care | Attending: Emergency Medicine | Admitting: Emergency Medicine

## 2021-03-25 DIAGNOSIS — F332 Major depressive disorder, recurrent severe without psychotic features: Secondary | ICD-10-CM

## 2021-03-25 DIAGNOSIS — Y900 Blood alcohol level of less than 20 mg/100 ml: Secondary | ICD-10-CM | POA: Insufficient documentation

## 2021-03-25 DIAGNOSIS — Z20822 Contact with and (suspected) exposure to covid-19: Secondary | ICD-10-CM | POA: Insufficient documentation

## 2021-03-25 DIAGNOSIS — F333 Major depressive disorder, recurrent, severe with psychotic symptoms: Secondary | ICD-10-CM | POA: Diagnosis not present

## 2021-03-25 DIAGNOSIS — F322 Major depressive disorder, single episode, severe without psychotic features: Secondary | ICD-10-CM | POA: Insufficient documentation

## 2021-03-25 DIAGNOSIS — Z79899 Other long term (current) drug therapy: Secondary | ICD-10-CM | POA: Insufficient documentation

## 2021-03-25 DIAGNOSIS — F32A Depression, unspecified: Secondary | ICD-10-CM

## 2021-03-25 LAB — URINE DRUG SCREEN, QUALITATIVE (ARMC ONLY)
Amphetamines, Ur Screen: NOT DETECTED
Barbiturates, Ur Screen: NOT DETECTED
Benzodiazepine, Ur Scrn: NOT DETECTED
Cannabinoid 50 Ng, Ur ~~LOC~~: NOT DETECTED
Cocaine Metabolite,Ur ~~LOC~~: NOT DETECTED
MDMA (Ecstasy)Ur Screen: NOT DETECTED
Methadone Scn, Ur: NOT DETECTED
Opiate, Ur Screen: NOT DETECTED
Phencyclidine (PCP) Ur S: NOT DETECTED
Tricyclic, Ur Screen: NOT DETECTED

## 2021-03-25 LAB — COMPREHENSIVE METABOLIC PANEL
ALT: 13 U/L (ref 0–44)
AST: 13 U/L — ABNORMAL LOW (ref 15–41)
Albumin: 4.4 g/dL (ref 3.5–5.0)
Alkaline Phosphatase: 54 U/L (ref 38–126)
Anion gap: 8 (ref 5–15)
BUN: 12 mg/dL (ref 6–20)
CO2: 27 mmol/L (ref 22–32)
Calcium: 8.9 mg/dL (ref 8.9–10.3)
Chloride: 105 mmol/L (ref 98–111)
Creatinine, Ser: 0.87 mg/dL (ref 0.44–1.00)
GFR, Estimated: >60 mL/min (ref >60)
Glucose, Bld: 113 mg/dL — ABNORMAL HIGH (ref 70–99)
Potassium: 4.1 mmol/L (ref 3.5–5.1)
Sodium: 140 mmol/L (ref 135–145)
Total Bilirubin: 0.5 mg/dL (ref 0.3–1.2)
Total Protein: 7.6 g/dL (ref 6.5–8.1)

## 2021-03-25 LAB — CBC
HCT: 40 % (ref 36.0–46.0)
Hemoglobin: 13.9 g/dL (ref 12.0–15.0)
MCH: 30.9 pg (ref 26.0–34.0)
MCHC: 34.8 g/dL (ref 30.0–36.0)
MCV: 88.9 fL (ref 80.0–100.0)
Platelets: 362 10*3/uL (ref 150–400)
RBC: 4.5 MIL/uL (ref 3.87–5.11)
RDW: 12.1 % (ref 11.5–15.5)
WBC: 8.7 10*3/uL (ref 4.0–10.5)
nRBC: 0 % (ref 0.0–0.2)

## 2021-03-25 LAB — ACETAMINOPHEN LEVEL: Acetaminophen (Tylenol), Serum: <10 ug/mL — ABNORMAL LOW (ref 10–30)

## 2021-03-25 LAB — RESP PANEL BY RT-PCR (FLU A&B, COVID) ARPGX2
Influenza A by PCR: NEGATIVE
Influenza B by PCR: NEGATIVE
SARS Coronavirus 2 by RT PCR: NEGATIVE

## 2021-03-25 LAB — PREGNANCY, URINE: Preg Test, Ur: NEGATIVE

## 2021-03-25 LAB — ETHANOL: Alcohol, Ethyl (B): <10 mg/dL (ref <10)

## 2021-03-25 LAB — SALICYLATE LEVEL: Salicylate Lvl: <7 mg/dL — ABNORMAL LOW (ref 7.0–30.0)

## 2021-03-25 MED ORDER — ACETAMINOPHEN 500 MG PO TABS
1000.0000 mg | ORAL_TABLET | Freq: Once | ORAL | Status: AC
  Administered 2021-03-25: 1000 mg via ORAL
  Filled 2021-03-25: qty 2

## 2021-03-25 MED ORDER — VITAMIN B-12 1000 MCG PO TABS
1000.0000 ug | ORAL_TABLET | Freq: Every day | ORAL | Status: DC
  Administered 2021-03-25: 1000 ug via ORAL
  Filled 2021-03-25 (×2): qty 1

## 2021-03-25 MED ORDER — FERROUS SULFATE 325 (65 FE) MG PO TBEC: 325.0000 mg | DELAYED_RELEASE_TABLET | Freq: Every day | ORAL | Status: DC

## 2021-03-25 MED ORDER — BUPROPION HCL ER (SR) 150 MG PO TB12: 300.0000 mg | ORAL_TABLET | Freq: Every day | ORAL | Status: DC

## 2021-03-25 MED ORDER — ARIPIPRAZOLE 5 MG PO TABS
2.5000 mg | ORAL_TABLET | Freq: Every day | ORAL | Status: DC
  Administered 2021-03-25: 2.5 mg via ORAL
  Filled 2021-03-25: qty 1

## 2021-03-25 MED ORDER — BUPROPION HCL ER (XL) 300 MG PO TB24: 300.0000 mg | ORAL_TABLET | Freq: Every day | ORAL | Status: DC

## 2021-03-25 NOTE — ED Notes (Signed)
Hourly rounding reveals patient in room. No complaints, stable, in no acute distress. Q15 minute rounds and monitoring via Security Cameras to continue. 

## 2021-03-25 NOTE — BH Assessment (Signed)
Patient is to be admitted to [facility] by Psychiatric Nurse Practitioner Andrews.  Attending Physician will be Dr. Domingo Cocking.   Patient has been assigned to room 320, by Garrett.   Intake Paper Work has been signed and placed on patient chart.  ER staff is aware of the admission: Melody ER Secretary   Dr. Tamala Julian, ER MD  [facility] Patient's Nurse  Loma Sousa Patient Access.

## 2021-03-25 NOTE — ED Notes (Signed)
Visitor at bedside at this time.  

## 2021-03-25 NOTE — ED Provider Notes (Signed)
[facility] Emergency Department Provider Note  ____________________________________________   I have reviewed the triage vital signs and the nursing notes.   HISTORY  Chief Complaint Psychiatric Evaluation   History limited by: Not Limited   HPI Leslie Elliott is a 45 y.o. female who presents to the emergency department today because of concerns for possible depression.  Patient states that she has history of depression.  She does see a psychiatrist.  She states that over the past week she has felt under more stress.  She does state that she noticed she was asking her self the depression screening questions that her doctor typically asked and realizing that she was starting to answer yes to them.  She has noticed weight loss over the past week which is unintentional.  Additionally she has not been interested in her daily activities like normal.  Patient states that she has had some fleeting thoughts of ending her self but denies any plan or true thoughts of wanting hurt herself to myself.  Records reviewed. Per medical record review patient has a history of depression.  Past Medical History:  Diagnosis Date   Anemia    Anxiety    Depression    Ovarian cyst     There are no problems to display for this patient.   Past Surgical History:  Procedure Laterality Date   ECT TREATMENTS     ROBOTIC ASSISTED BILATERAL SALPINGO OOPHERECTOMY Left 10/30/2020   Procedure: XI ROBOTIC ASSISTED SALPINGO OOPHORECTOMY;  Surgeon: Benjaman Kindler, MD;  Location: [facility] ORS;  Service: Gynecology;  Laterality: Left;   ROBOTIC ASSISTED LAPAROSCOPIC OVARIAN CYSTECTOMY Bilateral 10/30/2020   Procedure: XI ROBOTIC ASSISTED LAPAROSCOPIC OVARIAN CYSTECTOMY;  Surgeon: Benjaman Kindler, MD;  Location: [facility] ORS;  Service: Gynecology;  Laterality: Bilateral;   TUBAL LIGATION  2002   XI ROBOTIC ASSISTED SALPINGECTOMY Right 10/30/2020   Procedure: XI ROBOTIC ASSISTED SALPINGECTOMY;  Surgeon:  Benjaman Kindler, MD;  Location: [facility] ORS;  Service: Gynecology;  Laterality: Right;    Prior to Admission medications   Medication Sig Start Date End Date Taking? Authorizing Provider  ARIPiprazole (ABILIFY) 5 MG tablet Take 2.5 mg by mouth at bedtime. 06/28/20   [provider]  buPROPion (WELLBUTRIN SR) 150 MG 12 hr tablet Take 300 mg by mouth daily. 06/21/20   [provider]  docusate sodium (COLACE) 100 MG capsule Take 1 capsule (100 mg total) by mouth 2 (two) times daily. To keep stools soft 10/30/20   Benjaman Kindler, MD  ferrous sulfate 325 (65 FE) MG EC tablet Take 325 mg by mouth in the morning and at bedtime.    [provider]  gabapentin (NEURONTIN) 800 MG tablet Take 1 tablet (800 mg total) by mouth at bedtime for 14 days. Take nightly for 3 days, then up to 14 days as needed 10/30/20 11/13/20  Benjaman Kindler, MD  ibuprofen (ADVIL) 200 MG tablet Take 400 mg by mouth every 6 (six) hours as needed for moderate pain.    [provider]  oxyCODONE (OXY IR/ROXICODONE) 5 MG immediate release tablet Take 1 tablet (5 mg total) by mouth every 4 (four) hours as needed for severe pain. 10/30/20   Benjaman Kindler, MD  propranolol (INDERAL) 10 MG tablet Take 10 mg by mouth 2 (two) times daily as needed.    [provider]  Trypsin-Castor Oil-[place]u Balsam (OPTASE EX) Place 1 drop into both eyes at bedtime as needed (dry/irritated eyes).    [provider]  vitamin B-12 (  CYANOCOBALAMIN) 1000 MCG tablet Take 1,000 mcg by mouth daily.    [provider]    Allergies Patient has no known allergies.  Family History  Problem Relation Age of Onset   Breast cancer Neg Hx     Social History Social History   Tobacco Use   Smoking status: Never   Smokeless tobacco: Never  Vaping Use   Vaping Use: Never used  Substance Use Topics   Alcohol use: Yes    Comment: occassional   Drug use: Never    Review of Systems Constitutional: No  fever/chills Eyes: No visual changes. ENT: No sore throat. Cardiovascular: Denies chest pain. Respiratory: Denies shortness of breath. Gastrointestinal: No abdominal pain.  No nausea, no vomiting.  No diarrhea.   Genitourinary: Negative for dysuria. Musculoskeletal: Negative for back pain. Skin: Negative for rash. Neurological: Negative for headaches, focal weakness or numbness.  ____________________________________________   PHYSICAL EXAM:  VITAL SIGNS: ED Triage Vitals  Enc Vitals Group     BP 03/25/21 1310 133/73     Pulse Rate 03/25/21 1310 64     Resp 03/25/21 1310 18     Temp 03/25/21 1310 98 F (36.7 C)     Temp Source 03/25/21 1310 Oral     SpO2 03/25/21 1310 99 %     Weight 03/25/21 1311 157 lb (71.2 kg)     Height 03/25/21 1311 '5\' 4"'$  (1.626 m)     Head Circumference --      Peak Flow --      Pain Score 03/25/21 1311 0     Pain Loc --     Constitutional: Alert and oriented.  Eyes: Conjunctivae are normal.  ENT      Head: Normocephalic and atraumatic.      Nose: No congestion/rhinnorhea.      Mouth/Throat: Mucous membranes are moist.      Neck: No stridor. Hematological/Lymphatic/Immunilogical: No cervical lymphadenopathy. Cardiovascular: Normal rate, regular rhythm.  No murmurs, rubs, or gallops.  Respiratory: Normal respiratory effort without tachypnea nor retractions. Breath sounds are clear and equal bilaterally. No wheezes/rales/rhonchi. Gastrointestinal: Soft and non tender. No rebound. No guarding.  Genitourinary: Deferred Musculoskeletal: Normal range of motion in all extremities. No lower extremity edema. Neurologic:  Normal speech and language. No gross focal neurologic deficits are appreciated.  Skin:  Skin is warm, dry and intact. No rash noted. Psychiatric: Depressed  ____________________________________________    LABS (pertinent positives/negatives)  Acetaminophen, ethanol, salicylate below threshold CBC wbc 8.7, hgb 13.9, plt 362 CMP  wnl except glu 113, ast 13 UDS negative ____________________________________________   EKG  None  ____________________________________________    RADIOLOGY  None  ____________________________________________   PROCEDURES  Procedures  ____________________________________________   INITIAL IMPRESSION / ASSESSMENT AND PLAN / ED COURSE  Pertinent labs & imaging results that were available during my care of the patient were reviewed by me and considered in my medical decision making (see chart for details).   Patient presented to the emergency department today because of concerns for depression.  On exam patient does appear depressed.  Denies any SI at this time.  Will plan on having psychiatry evaluate.  At this time do not feel patient necessitates involuntary commitment.   ____________________________________________   FINAL CLINICAL IMPRESSION(S) / ED DIAGNOSES  Final diagnoses:  Depression, unspecified depression type     Note: This dictation was prepared with Dragon dictation. Any transcriptional errors that result from this process are unintentional     Nance Pear, MD 03/25/21  1512  

## 2021-03-25 NOTE — ED Notes (Signed)
Pt belongings include blue shirt, beige bra, black and white shorts, two blue shoes.   One yellow wedding band given to husband.  One pair glasses staying with patient.   Two bottles of rx meds-ability and welbutin also placed in bag. 1/1 belongings bag.

## 2021-03-25 NOTE — ED Triage Notes (Addendum)
Pt comes pov for psych eval. Pt reports that her husband made her come because she's been saying things related to wanting to hurt herself over and over to him and he is concerned she's going to hurt herself. Does endorse a plan to hurt herself but does not divulge to this RN. Pt voluntary at this time. Pt takes meds for depression meds. States that recently she's been overwhelmed with a lot going on and feels like she's been doing too much. Calm, cooperative at this time.

## 2021-03-25 NOTE — BH Assessment (Signed)
Comprehensive Clinical Assessment (CCA) Note  03/25/2021 Leslie Elliott [id number]  Chief Complaint:  Chief Complaint  Patient presents with   Psychiatric Evaluation   Visit Diagnosis: Major Depression   Leslie Elliott is a 45 year old female who presents to the ER via her husband due to the decline of her mental and emotional state. Per the patient, she is having thoughts of ending her life but doesn't want to follow through with it. The last time she felt this way was in 2017 and she had to be admitted. Prior that it was in 2007/2008 and needed an admission as well. Per the report of the patient's husband Herbie [place]), the patient started to decline but wasn't as bad as it is now. He noticed the rapid declined this past Friday and he was hoping to maintain her safety until he could contact her Psychiatrist on Monday. He secured the guns and put other safety measures in place. She would say things that suggest she wanted to end her life, but only to him. However, today (03/25/2021) she said something in front of their children. Which caused alarm for the husband. Per the husband, her current state is similar to the time she was admitted the last time.  He is unsure what she may do when he isn't around.  During the interview, the patient was calm, cooperative and pleasant. She answered majority of the questions with delay. Majority the time she answered, "I don't know." It appeared she was having a difficultly concentrating and giving answers. She denies the use of mind-altering substances and her UDS reflected the same. She has no history of violence or aggression.  CCA Screening, Triage and Referral (STR)  Patient Reported Information How did you hear about [place]? Family/Friend  What Is the Reason for Your Visit/Call Today? Brought in by her husband due to her voicing thoughts of ending her life.  How Long Has This Been Causing You Problems? 1-6 months  What Do You Feel Would Help You the  Most Today? Treatment for Depression or other mood problem   Have You Recently Had Any Thoughts About Hurting Yourself? Yes  Are You Planning to Commit Suicide/Harm Yourself At This time? Yes   Have you Recently Had Thoughts About Hurting Someone Guadalupe Dawn? No  Are You Planning to Harm Someone at This Time? No  Explanation: No data recorded  Have You Used Any Alcohol or Drugs in the Past 24 Hours? No  How Long Ago Did You Use Drugs or Alcohol? No data recorded What Did You Use and How Much? No data recorded  Do You Currently Have a Therapist/Psychiatrist? Yes  Name of Therapist/Psychiatrist: Dr. Thurmond Butts   Have You Been Recently Discharged From Any Office Practice or Programs? No  Explanation of Discharge From Practice/Program: No data recorded    CCA Screening Triage Referral Assessment Type of Contact: Face-to-Face  Telemedicine Service Delivery:   Is this Initial or Reassessment? No data recorded Date Telepsych consult ordered in CHL:  No data recorded Time Telepsych consult ordered in CHL:  No data recorded Location of Assessment: [facility] ED  Provider Location: [facility] ED   Collateral Involvement: No data recorded  Does Patient Have a Stacy? No data recorded Name and Contact of Legal Guardian: No data recorded If Minor and Not Living with Parent(s), Who has Custody? No data recorded Is CPS involved or ever been involved? Never  Is APS involved or ever been involved? Never   Patient Determined To Be At  Risk for Harm To Self or Others Based on Review of Patient Reported Information or Presenting Complaint? Yes, for Self-Harm  Method: No data recorded Availability of Means: No data recorded Intent: No data recorded Notification Required: No data recorded Additional Information for Danger to Others Potential: No data recorded Additional Comments for Danger to Others Potential: No data recorded Are There Guns or Other Weapons in Your Home? No data  recorded Types of Guns/Weapons: No data recorded Are These Weapons Safely Secured?                            No data recorded Who Could Verify You Are Able To Have These Secured: No data recorded Do You Have any Outstanding Charges, Pending Court Dates, Parole/Probation? No data recorded Contacted To Inform of Risk of Harm To Self or Others: No data recorded   Does Patient Present under Involuntary Commitment? No  IVC Papers Initial File Date: No data recorded  [place] of Residence: [place]   Patient Currently Receiving the Following Services: Medication Management   Determination of Need: Emergent (2 hours)   Options For Referral: Inpatient Hospitalization     CCA Biopsychosocial Patient Reported Schizophrenia/Schizoaffective Diagnosis in Past: No   Strengths: Have support system, some insight and want to get help.   Mental Health Symptoms Depression:   Change in energy/activity; Fatigue; Hopelessness; Increase/decrease in appetite; Sleep (too much or little); Tearfulness; Worthlessness   Duration of Depressive symptoms:  Duration of Depressive Symptoms: Greater than two weeks   Mania:   N/A   Anxiety:    Worrying; Difficulty concentrating   Psychosis:   None   Duration of Psychotic symptoms:    Trauma:   None   Obsessions:   N/A   Compulsions:   N/A   Inattention:   N/A   Hyperactivity/Impulsivity:   N/A   Oppositional/Defiant Behaviors:   N/A   Emotional Irregularity:   N/A   Other Mood/Personality Symptoms:  No data recorded   Mental Status Exam Appearance and self-care  Stature:   Average   Weight:   Average weight   Clothing:   Neat/clean; Age-appropriate   Grooming:   Normal   Cosmetic use:   None   Posture/gait:   Normal   Motor activity:   -- (Within normal range)   Sensorium  Attention:   Normal   Concentration:   Scattered   Orientation:   X5   Recall/memory:   Defective in Short-term; Defective in  Recent   Affect and Mood  Affect:   Depressed   Mood:   Depressed   Relating  Eye contact:   Fleeting   Facial expression:   Depressed   Attitude toward examiner:   Cooperative   Thought and Language  Speech flow:  Flight of Ideas; Slow   Thought content:   Appropriate to Mood and Circumstances   Preoccupation:   None   Hallucinations:   None   Organization:  No data recorded  [employer] of Knowledge:   Fair   Intelligence:   Average   Abstraction:   Functional   Judgement:   Impaired   Reality Testing:   Adequate   Insight:   Fair   Decision Making:   Confused   Social Functioning  Social Maturity:   Isolates   Social Judgement:   Impropriety   Stress  Stressors:   Other (Comment)   Coping Ability:   Overwhelmed  Skill Deficits:   None   Supports:   Family     Religion: Religion/Spirituality Are You A Religious Person?: Yes What is Your Religious Affiliation?: International aid/development worker: Leisure / Recreation Do You Have Hobbies?: No  Exercise/Diet: Exercise/Diet Do You Exercise?: No Have You Gained or Lost A Significant Amount of Weight in the Past Six Months?: No Do You Follow a Special Diet?: No Do You Have Any Trouble Sleeping?: No   CCA Employment/Education Employment/Work Situation: Employment / Work Situation Employment Situation: Unemployed Has Patient ever Been in Passenger transport manager?: No  Education: Education Is Patient Currently Attending School?: No Did You Have An Individualized Education Program (IIEP): No Did You Have Any Difficulty At [employer]?: No Patient's Education Has Been Impacted by Current Illness: No   CCA Family/Childhood History Family and Relationship History: Family history Marital status: Married Number of Years Married: 105 What types of issues is patient dealing with in the relationship?: None reported Additional relationship information: None reported Does  patient have children?: Yes How many children?: 3 How is patient's relationship with their children?: Good  Childhood History:  Childhood History By whom was/is the patient raised?: Both parents Did patient suffer any verbal/emotional/physical/sexual abuse as a child?: No Did patient suffer from severe childhood neglect?: No Has patient ever been sexually abused/assaulted/raped as an adolescent or adult?: No Was the patient ever a victim of a crime or a disaster?: No Witnessed domestic violence?: No Has patient been affected by domestic violence as an adult?: No  Child/Adolescent Assessment:     CCA Substance Use Alcohol/Drug Use: Alcohol / Drug Use Pain Medications: See PTA Prescriptions: See PTA Over the Counter: See PTA History of alcohol / drug use?: No history of alcohol / drug abuse Longest period of sobriety (when/how long): n/a  ASAM's:  Six Dimensions of Multidimensional Assessment  Dimension 1:  Acute Intoxication and/or Withdrawal Potential:      Dimension 2:  Biomedical Conditions and Complications:      Dimension 3:  Emotional, Behavioral, or Cognitive Conditions and Complications:     Dimension 4:  Readiness to Change:     Dimension 5:  Relapse, Continued use, or Continued Problem Potential:     Dimension 6:  Recovery/Living Environment:     ASAM Severity Score:    ASAM Recommended Level of Treatment:     Substance use Disorder (SUD)    Recommendations for Services/Supports/Treatments:    Discharge Disposition:    DSM5 Diagnoses: Patient Active Problem List   Diagnosis Date Noted   Major depressive disorder, 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

## 2021-03-25 NOTE — Consult Note (Signed)
[facility] Face-to-Face Psychiatry Consult   Reason for Consult:  Depression with suicidal ideations Referring Physician:  EDP Patient Identification: Leslie Elliott MRN:  [id number] Principal Diagnosis: Major depressive disorder, recurrent severe without psychotic features ([place]) Diagnosis:  Principal Problem:   Major depressive disorder, recurrent severe without psychotic features ([place])   Total Time spent with patient: 1 hour  Subjective:   Leslie Elliott is a 45 y.o. female patient admitted with suicide threat. She reports depression and anxiety with threats of suicide.  High level of depression with suicidal ideations, multiple attempts over the weekend with her husband intervening.  Last episode to this level was in 2007 which required hospitalization and ECT to stabilize.  Reports ruminating and self-deprecating.  She was brought to the ED by her husband this afternoon. Patient states that she has had suicidal ideations in the past, but that "my husband always stops me." Patient expressed desire to end her life in front of her children. Patient then took husband's glasses and tried to break them "to hurt myself."  Patient states that she feels "there's a spiritual battle going on inside my head", but did not elaborate. Patient states that she had been taking Wellbutrin and Abilify, "but I stopped taking that because I was starting to feel better." These meds were restarted on Friday. Patient tearful on interview, stating "I don't know if I'm the problem. I try to pray and trust in God." Patient endorses prior admission to inpatient psychiatric treatment in 2007-2008 for depressive episode. Patient states that she has an outpatient psychiatrist, but informs that he is retiring in October. Husband states that he found her in the bathroom with a knife this weekend.  Husband states that he has had to lock up all the knives and firearms in the house. He states that she has done ECT in the past (2007).  Husband states that patient "has been sliding over the past two weeks since she hasn't been taking her meds...it really came to a head Friday. She was gone." He states that she "is currently a different person." He states that patient has told him "I'm tired of fighting all of the demons."   Past Psychiatric History: depression, anxiety  Risk to Self:  yes Risk to Others:  none Prior Inpatient Therapy:  [facility] Prior Outpatient Therapy:  Dr Thurmond Butts  Past Medical History:  Past Medical History:  Diagnosis Date   Anemia    Anxiety    Depression    Ovarian cyst     Past Surgical History:  Procedure Laterality Date   ECT TREATMENTS     ROBOTIC ASSISTED BILATERAL SALPINGO OOPHERECTOMY Left 10/30/2020   Procedure: XI ROBOTIC ASSISTED SALPINGO OOPHORECTOMY;  Surgeon: Benjaman Kindler, MD;  Location: [facility] ORS;  Service: Gynecology;  Laterality: Left;   ROBOTIC ASSISTED LAPAROSCOPIC OVARIAN CYSTECTOMY Bilateral 10/30/2020   Procedure: XI ROBOTIC ASSISTED LAPAROSCOPIC OVARIAN CYSTECTOMY;  Surgeon: Benjaman Kindler, MD;  Location: [facility] ORS;  Service: Gynecology;  Laterality: Bilateral;   TUBAL LIGATION  2002   XI ROBOTIC ASSISTED SALPINGECTOMY Right 10/30/2020   Procedure: XI ROBOTIC ASSISTED SALPINGECTOMY;  Surgeon: Benjaman Kindler, MD;  Location: [facility] ORS;  Service: Gynecology;  Laterality: Right;   Family History:  Family History  Problem Relation Age of Onset   Breast cancer Neg Hx    Family Psychiatric  History: daughter with depression and anxiety Social History:  Social History   Substance and Sexual Activity  Alcohol Use Yes   Comment: occassional  Social History   Substance and Sexual Activity  Drug Use Never    Social History   Socioeconomic History   Marital status: Married    Spouse name: Herbie [place]   Number of children: Not on file   Years of education: Not on file   Highest education level: Not on file  Occupational History   Not on file  Tobacco Use   Smoking status:  Never   Smokeless tobacco: Never  Vaping Use   Vaping Use: Never used  Substance and Sexual Activity   Alcohol use: Yes    Comment: occassional   Drug use: Never   Sexual activity: Not on file  Other Topics Concern   Not on file  Social History Narrative   Not on file   Social Determinants of Health   Financial Resource Strain: Not on file  Food Insecurity: Not on file  Transportation Needs: Not on file  Physical Activity: Not on file  Stress: Not on file  Social Connections: Not on file   Additional Social History:    Allergies:  No Known Allergies  Labs:  Results for orders placed or performed during the hospital encounter of 03/25/21 (from the past 48 hour(s))  Comprehensive metabolic panel     Status: Abnormal   Collection Time: 03/25/21  1:14 PM  Result Value Ref Range   Sodium 140 135 - 145 mmol/L   Potassium 4.1 3.5 - 5.1 mmol/L   Chloride 105 98 - 111 mmol/L   CO2 27 22 - 32 mmol/L   Glucose, Bld 113 (H) 70 - 99 mg/dL    Comment: Glucose reference range applies only to samples taken after fasting for at least 8 hours.   BUN 12 6 - 20 mg/dL   Creatinine, Ser 0.87 0.44 - 1.00 mg/dL   Calcium 8.9 8.9 - 10.3 mg/dL   Total Protein 7.6 6.5 - 8.1 g/dL   Albumin 4.4 3.5 - 5.0 g/dL   AST 13 (L) 15 - 41 U/L   ALT 13 0 - 44 U/L   Alkaline Phosphatase 54 38 - 126 U/L   Total Bilirubin 0.5 0.3 - 1.2 mg/dL   GFR, Estimated >60 >60 mL/min    Comment: (NOTE) Calculated using the CKD-EPI Creatinine Equation (2021)    Anion gap 8 5 - 15    Comment: Performed at [facility], [address] [place], [place] [address]  Ethanol     Status: None   Collection Time: 03/25/21  1:14 PM  Result Value Ref Range   Alcohol, Ethyl (B) <10 <10 mg/dL    Comment: (NOTE) Lowest detectable limit for serum alcohol is 10 mg/dL.  For medical purposes only. Performed at [facility], [address] [place], [place] [id number]   Salicylate level     Status:  Abnormal   Collection Time: 03/25/21  1:14 PM  Result Value Ref Range   Salicylate Lvl [id number] (L) 7.0 - 30.0 mg/dL    Comment: Performed at [facility], [place], [place] [address]  Acetaminophen level     Status: Abnormal   Collection Time: 03/25/21  1:14 PM  Result Value Ref Range   Acetaminophen (Tylenol), Serum <10 (L) 10 - 30 ug/mL    Comment: (NOTE) Therapeutic concentrations vary significantly. A range of 10-30 ug/mL  may be an effective concentration for many patients. However, some  are best treated at concentrations outside of this range. Acetaminophen concentrations >150 ug/mL at 4 hours after ingestion  and >50 ug/mL at 12 hours after ingestion are often associated with  toxic reactions.  Performed at [facility], [address] [place], [place] [address]   cbc     Status: None   Collection Time: 03/25/21  1:14 PM  Result Value Ref Range   WBC 8.7 4.0 - 10.5 K/uL   RBC 4.50 3.87 - 5.11 MIL/uL   Hemoglobin 13.9 12.0 - 15.0 g/dL   HCT 40.0 36.0 - 46.0 %   MCV 88.9 80.0 - 100.0 fL   MCH 30.9 26.0 - 34.0 pg   MCHC 34.8 30.0 - 36.0 g/dL   RDW 12.1 11.5 - 15.5 %   Platelets 362 150 - 400 K/uL   nRBC 0.0 0.0 - 0.2 %    Comment: Performed at [facility], [address], [place], [place] [address]  Urine Drug Screen, Qualitative     Status: None   Collection Time: 03/25/21  1:14 PM  Result Value Ref Range   Tricyclic, Ur Screen NONE DETECTED NONE DETECTED   Amphetamines, Ur Screen NONE DETECTED NONE DETECTED   MDMA (Ecstasy)Ur Screen NONE DETECTED NONE DETECTED   Cocaine Metabolite,Ur [place] NONE DETECTED NONE DETECTED   Opiate, Ur Screen NONE DETECTED NONE DETECTED   Phencyclidine (PCP) Ur S NONE DETECTED NONE DETECTED   Cannabinoid 50 Ng, Ur [place] NONE DETECTED NONE DETECTED   Barbiturates, Ur Screen NONE DETECTED NONE DETECTED   Benzodiazepine, Ur Scrn NONE DETECTED NONE DETECTED   Methadone Scn, Ur NONE DETECTED NONE  DETECTED    Comment: (NOTE) Tricyclics + metabolites, urine    Cutoff 1000 ng/mL Amphetamines + metabolites, urine  Cutoff 1000 ng/mL MDMA (Ecstasy), urine              Cutoff 500 ng/mL Cocaine Metabolite, urine          Cutoff 300 ng/mL Opiate + metabolites, urine        Cutoff 300 ng/mL Phencyclidine (PCP), urine         Cutoff 25 ng/mL Cannabinoid, urine                 Cutoff 50 ng/mL Barbiturates + metabolites, urine  Cutoff 200 ng/mL Benzodiazepine, urine              Cutoff 200 ng/mL Methadone, urine                   Cutoff 300 ng/mL  The urine drug screen provides only a preliminary, unconfirmed analytical test result and should not be used for non-medical purposes. Clinical consideration and professional judgment should be applied to any positive drug screen result due to possible interfering substances. A more specific alternate chemical method must be used in order to obtain a confirmed analytical result. Gas chromatography / mass spectrometry (GC/MS) is the preferred confirm atory method. Performed at [facility], [address] [place],  [address]     No current facility-administered medications for this encounter.   Current Outpatient Medications  Medication Sig Dispense Refill   ARIPiprazole (ABILIFY) 5 MG tablet Take 2.5 mg by mouth at bedtime.     buPROPion (WELLBUTRIN SR) 150 MG 12 hr tablet Take 300 mg by mouth daily.     docusate sodium (COLACE) 100 MG capsule Take 1 capsule (100 mg total) by mouth 2 (two) times daily. To keep stools soft 30 capsule 0   ferrous sulfate 325 (65 FE) MG EC tablet Take 325 mg by mouth in the morning  and at bedtime.     gabapentin (NEURONTIN) 800 MG tablet Take 1 tablet (800 mg total) by mouth at bedtime for 14 days. Take nightly for 3 days, then up to 14 days as needed 14 tablet 0   ibuprofen (ADVIL) 200 MG tablet Take 400 mg by mouth every 6 (six) hours as needed for moderate pain.     oxyCODONE (OXY  IR/ROXICODONE) 5 MG immediate release tablet Take 1 tablet (5 mg total) by mouth every 4 (four) hours as needed for severe pain. 20 tablet 0   propranolol (INDERAL) 10 MG tablet Take 10 mg by mouth 2 (two) times daily as needed.     Trypsin-Castor Oil-[place] Balsam (OPTASE EX) Place 1 drop into both eyes at bedtime as needed (dry/irritated eyes).     vitamin B-12 (CYANOCOBALAMIN) 1000 MCG tablet Take 1,000 mcg by mouth daily.      Musculoskeletal: Strength & Muscle Tone: within normal limits Gait & Station: normal Patient leans: N/A  Psychiatric Specialty Exam: Physical Exam Vitals and nursing note reviewed.  Constitutional:      Appearance: Normal appearance.  HENT:     Nose: Nose normal.  Pulmonary:     [place]: Pulmonary effort is normal.  Musculoskeletal:        General: Normal range of motion.     Cervical back: Normal range of motion.  Neurological:     General: No focal deficit present.     Mental Status: She is alert and oriented to person, place, and time.  Psychiatric:        Attention and Perception: Attention and perception normal.        Mood and Affect: Mood is anxious and depressed.        Speech: Speech normal.        Behavior: Behavior normal. Behavior is cooperative.        Thought Content: Thought content includes suicidal ideation. Thought content includes suicidal plan.        Cognition and Memory: Cognition and memory normal.        Judgment: Judgment is impulsive.    Review of Systems  Psychiatric/Behavioral:  Positive for depression and suicidal ideas. The patient is nervous/anxious.   All other systems reviewed and are negative.  Blood pressure 133/73, pulse 64, temperature 98 F (36.7 C), temperature source Oral, resp. rate 18, height '5\' 4"'$  (1.626 m), weight 71.2 kg, last menstrual period 03/04/2021, SpO2 99 %.Body mass index is 26.95 kg/m.  General Appearance: Casual  Eye Contact:  Fair  Speech:  Slow  Volume:  Decreased  Mood:  Anxious and  Depressed  Affect:  Congruent  Thought Process:  Coherent and Descriptions of Associations: Intact  Orientation:  Full (Time, Place, and Person)  Thought Content:  Rumination  Suicidal Thoughts:  Yes.  with intent/plan  Homicidal Thoughts:  No  Memory:  Fair  Judgement:  Poor  Insight:  Fair  Psychomotor Activity:  Decreased  Concentration:  Concentration: Fair and Attention Span: Fair  Recall:  [employer] of Knowledge:  Fair  Language:  Fair  Akathisia:  No  Handed:  Right  AIMS (if indicated):     Assets:  Housing Leisure Time Physical Health Resilience Social Support  ADL's:  Intact  Cognition:  WNL  Sleep:        Physical Exam: Physical Exam Vitals and nursing note reviewed.  Constitutional:      Appearance: Normal appearance.  HENT:     Nose: Nose normal.  Pulmonary:     [place]: Pulmonary effort is normal.  Musculoskeletal:        General: Normal range of motion.     Cervical back: Normal range of motion.  Neurological:     General: No focal deficit present.     Mental Status: She is alert and oriented to person, place, and time.  Psychiatric:        Attention and Perception: Attention and perception normal.        Mood and Affect: Mood is anxious and depressed.        Speech: Speech normal.        Behavior: Behavior normal. Behavior is cooperative.        Thought Content: Thought content includes suicidal ideation. Thought content includes suicidal plan.        Cognition and Memory: Cognition and memory normal.        Judgment: Judgment is impulsive.   Review of Systems  Psychiatric/Behavioral:  Positive for depression and suicidal ideas. The patient is nervous/anxious.   All other systems reviewed and are negative. Blood pressure 133/73, pulse 64, temperature 98 F (36.7 C), temperature source Oral, resp. rate 18, height '5\' 4"'$  (1.626 m), weight 71.2 kg, last menstrual period 03/04/2021, SpO2 99 %. Body mass index is 26.95 kg/m.  Treatment Plan  Summary: Daily contact with patient to assess and evaluate symptoms and progress in treatment, Medication management, and Plan : Major depressive disorder, recurrent, severe without psychosis: -Restarted Abilify 2.5 mg daily at bedtime -Wellbutrin 300 mg daily   Disposition: Recommend psychiatric Inpatient admission when medically cleared.  Waylan Boga, NP 03/25/2021 3:03 PM

## 2021-03-25 NOTE — ED Notes (Signed)
Pt. Transferred to [facility] from ED to room 8 after screening for contraband. Report to include Situation, Background, Assessment and Recommendations from [facility]. Pt. Oriented to unit including Q15 minute rounds as well as the security cameras for their protection. Patient is alert and oriented, warm and dry in no acute distress. Patient denies SI, HI, and AVH. Pt. Encouraged to let me know if needs arise.

## 2021-03-26 ENCOUNTER — Inpatient Hospital Stay
Admission: RE | Admit: 2021-03-26 | Discharge: 2021-03-28 | DRG: 885 | Disposition: A | Discharge status: Home / Self Care | Payer: BC Managed Care – PPO | Source: Intra-hospital | Attending: Behavioral Health | Admitting: Behavioral Health

## 2021-03-26 ENCOUNTER — Encounter: Payer: Self-pay | Admitting: Psychiatric/Mental Health

## 2021-03-26 DIAGNOSIS — Z20822 Contact with and (suspected) exposure to covid-19: Secondary | ICD-10-CM | POA: Diagnosis present

## 2021-03-26 DIAGNOSIS — Z79899 Other long term (current) drug therapy: Secondary | ICD-10-CM

## 2021-03-26 DIAGNOSIS — F333 Major depressive disorder, recurrent, severe with psychotic symptoms: Secondary | ICD-10-CM | POA: Diagnosis present

## 2021-03-26 DIAGNOSIS — G4700 Insomnia, unspecified: Secondary | ICD-10-CM | POA: Diagnosis present

## 2021-03-26 DIAGNOSIS — R9389 Abnormal findings on diagnostic imaging of other specified body structures: Secondary | ICD-10-CM | POA: Insufficient documentation

## 2021-03-26 DIAGNOSIS — Z8742 Personal history of other diseases of the female genital tract: Secondary | ICD-10-CM | POA: Insufficient documentation

## 2021-03-26 DIAGNOSIS — R45851 Suicidal ideations: Secondary | ICD-10-CM | POA: Diagnosis present

## 2021-03-26 DIAGNOSIS — F419 Anxiety disorder, unspecified: Secondary | ICD-10-CM | POA: Diagnosis present

## 2021-03-26 MED ORDER — TRAZODONE HCL 50 MG PO TABS
50.0000 mg | ORAL_TABLET | Freq: Every evening | ORAL | Status: DC | PRN
Start: 2021-03-26 — End: 2021-03-28
  Administered 2021-03-26 – 2021-03-27 (×2): 50 mg via ORAL
  Filled 2021-03-26 (×2): qty 1

## 2021-03-26 MED ORDER — ARIPIPRAZOLE 5 MG PO TABS: 2.5000 mg | ORAL_TABLET | Freq: Every day | ORAL | Status: DC

## 2021-03-26 MED ORDER — BUPROPION HCL ER (XL) 150 MG PO TB24
300.0000 mg | ORAL_TABLET | Freq: Every day | ORAL | Status: DC
  Administered 2021-03-26 – 2021-03-28 (×3): 300 mg via ORAL
  Filled 2021-03-26 (×3): qty 2

## 2021-03-26 MED ORDER — ALUM & MAG HYDROXIDE-SIMETH 200-200-20 MG/5ML PO SUSP
30.0000 mL | ORAL | Status: DC | PRN
Start: 2021-03-26 — End: 2021-03-28
  Administered 2021-03-26: 30 mL via ORAL
  Filled 2021-03-26: qty 30

## 2021-03-26 MED ORDER — ACETAMINOPHEN 325 MG PO TABS: 650.0000 mg | ORAL_TABLET | Freq: Four times a day (QID) | ORAL | Status: DC | PRN

## 2021-03-26 MED ORDER — MAGNESIUM HYDROXIDE 400 MG/5ML PO SUSP
30.0000 mL | Freq: Every day | ORAL | Status: DC | PRN
Start: 2021-03-26 — End: 2021-03-28

## 2021-03-26 MED ORDER — HYDROXYZINE HCL 25 MG PO TABS
25.0000 mg | ORAL_TABLET | Freq: Three times a day (TID) | ORAL | Status: DC | PRN
  Administered 2021-03-27: 25 mg via ORAL
  Filled 2021-03-26: qty 1

## 2021-03-26 MED ORDER — VITAMIN B-12 1000 MCG PO TABS
1000.0000 ug | ORAL_TABLET | Freq: Every day | ORAL | Status: DC
  Administered 2021-03-26 – 2021-03-28 (×3): 1000 ug via ORAL
  Filled 2021-03-26 (×4): qty 1

## 2021-03-26 MED ORDER — ARIPIPRAZOLE 5 MG PO TABS
5.0000 mg | ORAL_TABLET | Freq: Every day | ORAL | Status: DC
  Administered 2021-03-26 – 2021-03-27 (×2): 5 mg via ORAL
  Filled 2021-03-26 (×2): qty 1

## 2021-03-26 NOTE — BHH Suicide Risk Assessment (Signed)
[place] INPATIENT:  Family/Significant Other Suicide Prevention Education  Suicide Prevention Education:  Education Completed; Leslie Elliott/husband [phone]), has been identified by the patient as the family member/significant other with whom the patient will be residing, and identified as the person(s) who will aid the patient in the event of a mental health crisis (suicidal ideations/suicide attempt).  With written consent from the patient, the family member/significant other has been provided the following suicide prevention education, prior to the and/or following the discharge of the patient.  The suicide prevention education provided includes the following: Suicide risk factors Suicide prevention and interventions National Suicide Hotline telephone number [facility] assessment telephone number [facility] Emergency Assistance [place] and/or Residential Mobile Crisis Unit telephone number  Request made of family/significant other to: Remove weapons (e.g., guns, rifles, knives), all items previously/currently identified as safety concern.   Remove drugs/medications (over-the-counter, prescriptions, illicit drugs), all items previously/currently identified as a safety concern.  The family member/significant other verbalizes understanding of the suicide prevention education information provided.  The family member/significant other agrees to remove the items of safety concern listed above.  Supak shared that pt did this in 2007 the first time. He stated that pt had convinced her doctor that she was doing well and got taken off of her medication. Armas shared that he had put up all the items that pt could use to hurt herself. He says that he does not feel that pt is ready for discharge at the moment but said that he would not disagree with the doctor whenever pt was cleared for discharge. He did stress feeling that pt needed to have a psychiatrist as her previous  one, Dr. Thurmond Butts, is retiring in October. Mc[place] stated that Dr. Thurmond Butts did give them a list of providers to look into including Dr. Weber Cooks at [facility] and Dr. Rosine Door in [place]. CSW stated that team would work on getting pt an appointment prior to her discharge. No other concerns expressed. Contact ended without incident.   Shirl Harris 03/26/2021, 4:11 PM

## 2021-03-26 NOTE — Progress Notes (Signed)
Patient is calm and cooperative with assessment. She denies suicidal ideations, homicidal ideations, and auditory and visual hallucinations. She rates her depression as a 3 and anxiety as a 3. She reports fair sleep, with some trouble staying asleep throughout the night. She states her appetite was poor yesterday but she did eat some breakfast this morning. She is compliant with scheduled medications. She is observed to be interacting appropriately with staff and other patients on the unit. Patient remains safe on the unit at this time.

## 2021-03-26 NOTE — ED Notes (Signed)
Hourly rounding reveals patient in room. No complaints, stable, in no acute distress. Q15 minute rounds and monitoring via Security Cameras to continue. 

## 2021-03-26 NOTE — Progress Notes (Signed)
Recreation Therapy Notes  Date: 03/26/2021  Time: 9:30 am   Location: Courtyard  Behavioral response: Appropriate  Intervention Topic: Social Skills    Discussion/Intervention:  Group content on today was focused on social skills. The group defined social skills and identified ways they use social skills. Patients expressed what obstacles they face when trying to be social. Participants described the importance of social skills. The group listed ways to improve social skills and reasons to improve social skills. Individuals had an opportunity to learn new and improve social skills as well as identify their weaknesses. Clinical Observations/Feedback: Patient came to group and was active while being social with peers and staff. Nielle Duford LRT/CTRS         [name] 03/26/2021 12:34 PM

## 2021-03-26 NOTE — Tx Team (Signed)
Initial Treatment Plan 03/26/2021 2:37 AM Leslie Elliott [id number]    PATIENT STRESSORS: Marital or family conflict   PATIENT STRENGTHS: Ability for insight Active sense of humor Average or above average intelligence Capable of independent living   PATIENT IDENTIFIED PROBLEMS:       suicidal               DISCHARGE CRITERIA:  Ability to meet basic life and health needs Adequate post-discharge living arrangements Improved stabilization in mood, thinking, and/or behavior Medical problems require only outpatient monitoring Motivation to continue treatment in a less acute level of care Need for constant or close observation no longer present Reduction of life-threatening or endangering symptoms to within safe limits Safe-care adequate arrangements made Verbal commitment to aftercare and medication compliance  PRELIMINARY DISCHARGE PLAN: Outpatient therapy Return to previous living arrangement  PATIENT/FAMILY INVOLVEMENT: This treatment plan has been presented to and reviewed with the patient, Leslie Elliott, and/or family member. The patient and family have been given the opportunity to ask questions and make suggestions.  Libby Maw, RN 03/26/2021, 2:37 AM

## 2021-03-26 NOTE — ED Notes (Signed)
Patient is stable in NAD. She is transferred to [facility] via NT and officer. Report given to [facility] RN. Patient belongings taken to BMU. No issues.

## 2021-03-26 NOTE — BHH Counselor (Signed)
CSW met with pt to complete the PSA. During interaction, pt and CSW discussed aftercare. She stated that she needed to speak with her husband before making a decision about an outpatient provider. CSW agreed, stating that team would check with her later about this. No other concerns expressed. Contact ended without incident.   Chalmers Guest. Guerry Bruin, MSW, [place], [place] 03/26/2021 3:45 PM

## 2021-03-26 NOTE — Group Note (Signed)
[facility] LCSW Group Therapy Note    Group Date: 03/26/2021 Start Time: [id number] End Time: 1400  Type of Therapy and Topic:  Group Therapy:  Overcoming Obstacles  Participation Level:  BHH PARTICIPATION LEVEL: Minimal    Description of Group:   In this group patients will be encouraged to explore what they see as obstacles to their own wellness and recovery. They will be guided to discuss their thoughts, feelings, and behaviors related to these obstacles. The group will process together ways to cope with barriers, with attention given to specific choices patients can make. Each patient will be challenged to identify changes they are motivated to make in order to overcome their obstacles. This group will be process-oriented, with patients participating in exploration of their own experiences as well as giving and receiving support and challenge from other group members.  Therapeutic Goals: 1. Patient will identify personal and current obstacles as they relate to admission. 2. Patient will identify barriers that currently interfere with their wellness or overcoming obstacles.  3. Patient will identify feelings, thought process and behaviors related to these barriers. 4. Patient will identify two changes they are willing to make to overcome these obstacles:    Summary of Patient Progress   Patient was present for the entirety of the group session. Patient was an active listener and participated in the topic of discussion, provided helpful advice to others, and added nuance to topic of conversation. She stated that she wants to maintain recovery and uses coping strategies such as puzzles and activity/game applications on her phone that support her emotional wellbeing.    Therapeutic Modalities:   Cognitive Behavioral Therapy Solution Focused Therapy Motivational Interviewing Relapse Prevention Therapy   Leslie Elliott [place], LCSWA

## 2021-03-26 NOTE — Plan of Care (Signed)
  Problem: Education: Goal: Knowledge of [facility] Education information/materials will improve Outcome: Progressing Goal: Emotional status will improve Outcome: Progressing Goal: Mental status will improve Outcome: Progressing Goal: Verbalization of understanding the information provided will improve Outcome: Progressing   Problem: Activity: Goal: Interest or engagement in activities will improve Outcome: Progressing Goal: Sleeping patterns will improve Outcome: Progressing   Problem: Coping: Goal: Ability to verbalize frustrations and anger appropriately will improve Outcome: Progressing Goal: Ability to demonstrate self-control will improve Outcome: Progressing   Problem: Health Behavior/Discharge Planning: Goal: Identification of resources available to assist in meeting health care needs will improve Outcome: Progressing Goal: Compliance with treatment plan for underlying cause of condition will improve Outcome: Progressing   Problem: Physical Regulation: Goal: Ability to maintain clinical measurements within normal limits will improve Outcome: Progressing   Problem: Safety: Goal: Periods of time without injury will increase Outcome: Progressing   Problem: Education: Goal: Utilization of techniques to improve thought processes will improve Outcome: Progressing Goal: Knowledge of the prescribed therapeutic regimen will improve Outcome: Progressing   Problem: Activity: Goal: Interest or engagement in leisure activities will improve Outcome: Progressing Goal: Imbalance in normal sleep/wake cycle will improve Outcome: Progressing   Problem: Coping: Goal: Coping ability will improve Outcome: Progressing Goal: Will verbalize feelings Outcome: Progressing   Problem: Health Behavior/Discharge Planning: Goal: Ability to make decisions will improve Outcome: Progressing Goal: Compliance with therapeutic regimen will improve Outcome: Progressing    Problem: Role Relationship: Goal: Will demonstrate positive changes in social behaviors and relationships Outcome: Progressing   Problem: Safety: Goal: Ability to disclose and discuss suicidal ideas will improve Outcome: Progressing Goal: Ability to identify and utilize support systems that promote safety will improve Outcome: Progressing   Problem: Self-Concept: Goal: Will verbalize positive feelings about self Outcome: Progressing Goal: Level of anxiety will decrease Outcome: Progressing

## 2021-03-26 NOTE — [facility] Suicide Risk Assessment (Signed)
[facility] Admission Suicide Risk Assessment   Nursing information obtained from:  Patient Demographic factors:  Caucasian Current Mental Status:  Suicidal ideation indicated by others Loss Factors:  NA Historical Factors:  Impulsivity Risk Reduction Factors:  Sense of responsibility to family  Total Time spent with patient: 1 hour Principal Problem: MDD (major depressive disorder), recurrent, severe, with psychosis ([place]) Diagnosis:  Principal Problem:   MDD (major depressive disorder), recurrent, severe, with psychosis ([place])  Subjective Data: 45 year old female presenting for worsening depression, paranoia, and suicidal ideations in the context of stopping medications. No acute events overnight, medication compliant, attending to ADLs. Patient seen during treatment team and again one-on-one. Her goals are to get better, and go home. She has previously been stable for many years on Wellbutrin, and Abilify. She then stopped Abilify thinking she was doing well, and subsequently declined. She notes an overall feeling of extreme paranoia where everyone knows about something bad that is about to happen, but her. She also notes worsening depression and suicidal ideations. She notes fighting in her head about thoughts of angels and demons that were mentioned in a church service. She restarted her Abilify on Friday, and today notes that she is starting to feel better. She denies any suicidal ideations, homicidal ideations, visual hallucinations, and auditory hallucinations. She continues to have paranoia, but notes it is lessening. She gives permission to speak to her husband. She feels she has some stressors including moving to a new house, her psychiatrist retiring, and notes that her decompensations tend to happen this time of year in general.    Julius Bowels at [phone]. Herbie [place] notes that he has felt something was not right for the last two weeks, but patient acutely worsening over the weekend. She did  tell him she stopped Abilify, and he was able to convince her to restart this Friday night. She began making comments that she was tired of fighting demons, and wanted to end it. He hid all the knives and weapons in the house. On Sunday, she began making those comments in front of the kids which scared him, and prompted him to bring patient to the emergency. While in the ER she asked for husbands reading glasses and tried to break them apparently in an attempt to get glass to cut herself. He notes that she is good at hiding her symptoms, and he remains concerned about her safety. He feels she is not safe to return home yet, and also feels she would benefit from psychiatrist and therapist at discharge.   Continued Clinical Symptoms:  Alcohol Use Disorder Identification Test Final Score (AUDIT): 0 The "Alcohol Use Disorders Identification Test", Guidelines for Use in Primary Care, Second Edition.  World Pharmacologist [facility]). Score between 0-7:  no or low risk or alcohol related problems. Score between 8-15:  moderate risk of alcohol related problems. Score between 16-19:  high risk of alcohol related problems. Score 20 or above:  warrants further diagnostic evaluation for alcohol dependence and treatment.   CLINICAL FACTORS:   Severe Anxiety and/or Agitation Depression:   Hopelessness Impulsivity Severe Currently Psychotic Previous Psychiatric Diagnoses and Treatments   Musculoskeletal: Strength & Muscle Tone: within normal limits Gait & Station: normal Patient leans: N/A  Psychiatric Specialty Exam:  Presentation  General Appearance: Appropriate for Environment; Casual  Eye Contact:Fair  Speech:Normal Rate; Clear and Coherent  Speech Volume:Normal  Handedness:Right   Mood and Affect  Mood:Anxious; Depressed  Affect:Congruent   Thought Process  Thought Processes:Coherent; Goal Directed  Descriptions  of Associations:Intact  Orientation:Full (Time, Place and  Person)  Thought Content:Paranoid Ideation  History of Schizophrenia/Schizoaffective disorder:No  Duration of Psychotic Symptoms:N/A  Hallucinations:Hallucinations: None  Ideas of Reference:None  Suicidal Thoughts:Suicidal Thoughts: No  Homicidal Thoughts:Homicidal Thoughts: No   Sensorium  Memory:Immediate Fair; Recent Fair; Remote Fair  Judgment:Intact  Insight:Present   Executive Functions  Concentration:Fair  Attention Span:Fair  [place]   Psychomotor Activity  Psychomotor Activity:Psychomotor Activity: Normal   Assets  Assets:Communication Skills; Desire for Improvement; Financial Resources/Insurance; Housing; Intimacy; Resilience; Social Support; Talents/Skills   Sleep  Sleep:Sleep: Fair    Physical Exam: Physical Exam ROS Blood pressure 133/90, pulse 67, temperature 98 F (36.7 C), temperature source Oral, resp. rate 18, height '5\' 4"'$  (1.626 m), weight 69.4 kg, last menstrual period 03/04/2021, SpO2 98 %. Body mass index is 26.26 kg/m.   COGNITIVE FEATURES THAT CONTRIBUTE TO RISK:  Loss of executive function    SUICIDE RISK:   Mild:  Suicidal ideation of limited frequency, intensity, duration, and specificity.  There are no identifiable plans, no associated intent, mild dysphoria and related symptoms, good self-control (both objective and subjective assessment), few other risk factors, and identifiable protective factors, including available and accessible social support.  PLAN OF CARE: Continue admission, see H&P for details.   I certify that inpatient services furnished can reasonably be expected to improve the patient's condition.   Salley Scarlet, MD 03/26/2021, 2:17 PM

## 2021-03-26 NOTE — Plan of Care (Signed)
°  Problem: Education: °Goal: Emotional status will improve °Outcome: Progressing °Goal: Mental status will improve °Outcome: Progressing °  °Problem: Activity: °Goal: Interest or engagement in activities will improve °Outcome: Progressing °  °

## 2021-03-26 NOTE — Progress Notes (Signed)
Recreation Therapy Notes  INPATIENT RECREATION THERAPY ASSESSMENT  Patient Details Name: NAZIAH MEHDI MRN: [id number] DOB: 07/24/1976 Today's Date: 03/26/2021       Information Obtained From: Patient  Able to Participate in Assessment/Interview: Yes  Patient Presentation: Responsive  Reason for Admission (Per Patient): Active Symptoms, Med Non-Compliance  Patient Stressors:    Coping Skills:   Music, Art, Other (Comment) (Card games, puzzles)  Leisure Interests (2+):  Social - Family, Games - Mining engineer games, Music - Listen, Individual - TV  Frequency of Recreation/Participation: Weekly  Awareness of Community Resources:  Yes  Community Resources:  [employer]  Current Use: Yes  If no, Barriers?:    Expressed Interest in [place]: Yes  [employer] of Residence:  Insurance underwriter  Patient Main Form of Transportation: Musician  Patient Strengths:  Surveyor, mining, Copywriter, advertising, caring  Patient Identified Areas of Improvement:  Be more encouraging  Patient Goal for Hospitalization:  Make sure that i  am fine  Current SI (including self-harm):  No  Current HI:  No  Current AVH: No  Staff Intervention Plan: Group Attendance, Collaborate with Interdisciplinary Treatment Team  Consent to Intern Participation: N/A  Klohe Lovering 03/26/2021, 3:24 PM

## 2021-03-26 NOTE — BH IP Treatment Plan (Signed)
Interdisciplinary Treatment and Diagnostic Plan Update  03/26/2021 Time of Session: 9:00 AM Leslie Elliott MRN: [id number]  Principal Diagnosis: <principal problem not specified>  Secondary Diagnoses: Active Problems:   * No active hospital problems. *   Current Medications:  Current Facility-Administered Medications  Medication Dose Route Frequency Provider Last Rate Last Admin   acetaminophen (TYLENOL) tablet 650 mg  650 mg Oral Q6H PRN Deloria Lair, NP       alum & mag hydroxide-simeth (MAALOX/MYLANTA) 200-200-20 MG/5ML suspension 30 mL  30 mL Oral Q4H PRN Deloria Lair, NP   30 mL at 03/26/21 0143   ARIPiprazole (ABILIFY) tablet 2.5 mg  2.5 mg Oral QHS Deloria Lair, NP       buPROPion (WELLBUTRIN XL) 24 hr tablet 300 mg  300 mg Oral Daily Doren Custard, Rashaun M, NP   300 mg at 03/26/21 0815   hydrOXYzine (ATARAX/VISTARIL) tablet 25 mg  25 mg Oral TID PRN Deloria Lair, NP       magnesium hydroxide (MILK OF MAGNESIA) suspension 30 mL  30 mL Oral Daily PRN Deloria Lair, NP       traZODone (DESYREL) tablet 50 mg  50 mg Oral QHS PRN Deloria Lair, NP       vitamin B-12 (CYANOCOBALAMIN) tablet 1,000 mcg  1,000 mcg Oral Daily Anette Riedel M, NP   1,000 mcg at 03/26/21 0815   PTA Medications: Medications Prior to Admission  Medication Sig Dispense Refill Last Dose   ARIPiprazole (ABILIFY) 5 MG tablet Take 2.5 mg by mouth at bedtime.      buPROPion (WELLBUTRIN SR) 150 MG 12 hr tablet Take 300 mg by mouth daily.      docusate sodium (COLACE) 100 MG capsule Take 1 capsule (100 mg total) by mouth 2 (two) times daily. To keep stools soft (Patient not taking: Reported on 03/25/2021) 30 capsule 0    ferrous sulfate 325 (65 FE) MG EC tablet Take 325 mg by mouth in the morning and at bedtime. (Patient not taking: Reported on 03/25/2021)      gabapentin (NEURONTIN) 800 MG tablet Take 1 tablet (800 mg total) by mouth at bedtime for 14 days. Take nightly for 3 days, then up to 14 days  as needed 14 tablet 0    ibuprofen (ADVIL) 200 MG tablet Take 400 mg by mouth every 6 (six) hours as needed for moderate pain.      propranolol (INDERAL) 10 MG tablet Take 10 mg by mouth 2 (two) times daily as needed. (Patient not taking: Reported on 03/25/2021)      Trypsin-Castor Oil-[place] Balsam (OPTASE EX) Place 1 drop into both eyes at bedtime as needed (dry/irritated eyes).      vitamin B-12 (CYANOCOBALAMIN) 1000 MCG tablet Take 1,000 mcg by mouth daily. (Patient not taking: Reported on 03/25/2021)       Patient Stressors: Marital or family conflict  Patient Strengths: Ability for insight Active sense of humor Average or above average intelligence Capable of independent living  Treatment Modalities: Medication Management, Group therapy, Case management,  1 to 1 session with clinician, Psychoeducation, Recreational therapy.   Physician Treatment Plan for Primary Diagnosis: <principal problem not specified> Long Term Goal(s):     Short Term Goals:    Medication Management: Evaluate patient's response, side effects, and tolerance of medication regimen.  Therapeutic Interventions: 1 to 1 sessions, Unit Group sessions and Medication administration.  Evaluation of Outcomes: Progressing  Physician Treatment Plan for Secondary Diagnosis: Active  Problems:   * No active hospital problems. *  Long Term Goal(s):     Short Term Goals:       Medication Management: Evaluate patient's response, side effects, and tolerance of medication regimen.  Therapeutic Interventions: 1 to 1 sessions, Unit Group sessions and Medication administration.  Evaluation of Outcomes: Progressing   RN Treatment Plan for Primary Diagnosis: <principal problem not specified> Long Term Goal(s): Knowledge of disease and therapeutic regimen to maintain health will improve  Short Term Goals: Ability to remain free from injury will improve, Ability to demonstrate self-control, Ability to participate in decision  making will improve, Ability to verbalize feelings will improve, Ability to identify and develop effective coping behaviors will improve, and Compliance with prescribed medications will improve  Medication Management: RN will administer medications as ordered by provider, will assess and evaluate patient's response and provide education to patient for prescribed medication. RN will report any adverse and/or side effects to prescribing provider.  Therapeutic Interventions: 1 on 1 counseling sessions, Psychoeducation, Medication administration, Evaluate responses to treatment, Monitor vital signs and CBGs as ordered, Perform/monitor CIWA, COWS, AIMS and Fall Risk screenings as ordered, Perform wound care treatments as ordered.  Evaluation of Outcomes: Progressing   LCSW Treatment Plan for Primary Diagnosis: <principal problem not specified> Long Term Goal(s): Safe transition to appropriate next level of care at discharge, Engage patient in therapeutic group addressing interpersonal concerns.  Short Term Goals: Engage patient in aftercare planning with referrals and resources, Increase ability to appropriately verbalize feelings, Increase emotional regulation, Identify triggers associated with mental health/substance abuse issues, and Increase skills for wellness and recovery  Therapeutic Interventions: Assess for all discharge needs, 1 to 1 time with Social worker, Explore available resources and support systems, Assess for adequacy in community support network, Educate family and significant other(s) on suicide prevention, Complete Psychosocial Assessment, Interpersonal group therapy.  Evaluation of Outcomes: Progressing   Progress in Treatment: Attending groups: No. Participating in groups: No. Taking medication as prescribed: Yes. Toleration medication: Yes. Family/Significant other contact made: No, will contact:  when given permission. Patient understands diagnosis: Yes. Discussing  patient identified problems/goals with staff: Yes. Medical problems stabilized or resolved: Yes. Denies suicidal/homicidal ideation: Yes. Issues/concerns per patient self-inventory: No. Other: none  New problem(s) identified: No, Describe:  none.  New Short Term/Long Term Goal(s): medication management for mood stabilization; elimination of SI thoughts; development of comprehensive mental wellness plan.  Patient Goals: "To go home, feel well enough to go home."    Discharge Plan or Barriers: CSW will assist pt with development of an appropriate aftercare/discharge plan.   Reason for Continuation of Hospitalization: Depression Medication stabilization Suicidal ideation  Estimated Length of Stay: 1-7 days   Scribe for Treatment Team: Shirl Harris, LCSW 03/26/2021 1:53 PM

## 2021-03-26 NOTE — Progress Notes (Signed)
Recreation Therapy Notes  INPATIENT RECREATION TR PLAN  Patient Details Name: Leslie Elliott MRN: [id number] DOB: [birth year] Today's Date: 03/26/2021  Rec Therapy Plan Is patient appropriate for Therapeutic Recreation?: Yes Treatment times per week: at least 3 Estimated Length of Stay: 5-7 days TR Treatment/Interventions: Group participation (Comment)  Discharge Criteria Pt will be discharged from therapy if:: Discharged Treatment plan/goals/alternatives discussed and agreed upon by:: Patient/family  Discharge Summary     Rease Wence 03/26/2021, 3:25 PM

## 2021-03-26 NOTE — Progress Notes (Signed)
Patient admitted as a voluntary patient. Patient is pleasant, sad, calm and cooperative. Denies SI, HI and AVH. Skin search done with [place] RN and yielded no wounds, cuts or scars and no contraband.

## 2021-03-26 NOTE — H&P (Signed)
Psychiatric Admission Assessment Adult  Patient Identification: Leslie Elliott MRN:  [id number] Date of Evaluation:  03/26/2021 Chief Complaint:  Depression [F32.A] Principal Diagnosis: MDD (major depressive disorder), recurrent, severe, with psychosis ([place]) Diagnosis:  Principal Problem:   MDD (major depressive disorder), recurrent, severe, with psychosis ([place])  CC "I shouldn't have stopped by medication."  History of Present Illness: 45 year old female presenting for worsening depression, paranoia, and suicidal ideations in the context of stopping medications. No acute events overnight, medication compliant, attending to ADLs. Patient seen during treatment team and again one-on-one. Her goals are to get better, and go home. She has previously been stable for many years on Wellbutrin, and Abilify. She then stopped Abilify thinking she was doing well, and subsequently declined. She notes an overall feeling of extreme paranoia where everyone knows about something bad that is about to happen, but her. She also notes worsening depression and suicidal ideations. She notes fighting in her head about thoughts of angels and demons that were mentioned in a church service. She restarted her Abilify on Friday, and today notes that she is starting to feel better. She denies any suicidal ideations, homicidal ideations, visual hallucinations, and auditory hallucinations. She continues to have paranoia, but notes it is lessening. She gives permission to speak to her husband. She feels she has some stressors including moving to a new house, her psychiatrist retiring, and notes that her decompensations tend to happen this time of year in general.   Julius Bowels at [phone]. Herbie Baltimore notes that he has felt something was not right for the last two weeks, but patient acutely worsening over the weekend. She did tell him she stopped Abilify, and he was able to convince her to restart this Friday night. She began  making comments that she was tired of fighting demons, and wanted to end it. He hid all the knives and weapons in the house. On Sunday, she began making those comments in front of the kids which scared him, and prompted him to bring patient to the emergency. While in the ER she asked for husbands reading glasses and tried to break them apparently in an attempt to get glass to cut herself. He notes that she is good at hiding her symptoms, and he remains concerned about her safety. He feels she is not safe to return home yet, and also feels she would benefit from psychiatrist and therapist at discharge.  Associated Signs/Symptoms: Depression Symptoms:  depressed mood, fatigue, feelings of worthlessness/guilt, hopelessness, suicidal thoughts with specific plan, anxiety, weight loss, Duration of Depression Symptoms: Greater than two weeks  (Hypo) Manic Symptoms:  Hallucinations, Anxiety Symptoms:  Excessive Worry, Psychotic Symptoms:  Hallucinations: Auditory Paranoia, PTSD Symptoms: Negative Total Time spent with patient: 1 hour  Past Psychiatric History: Past history of MDD with psychotic features. Has done well on Wellbutrin and Abilify for many years. Used to see Dr. Thurmond Butts who is now retiring. History of prior hospitalization and ECT treatment for depression.   Is the patient at risk to self? Yes.    Has the patient been a risk to self in the past 6 months? No.  Has the patient been a risk to self within the distant past? Yes.    Is the patient a risk to others? No.  Has the patient been a risk to others in the past 6 months? No.  Has the patient been a risk to others within the distant past? No.   Prior Inpatient Therapy:   Prior Outpatient Therapy:  Alcohol Screening: 1. How often do you have a drink containing alcohol?: Never 2. How many drinks containing alcohol do you have on a typical day when you are drinking?: 1 or 2 3. How often do you have six or more drinks on one  occasion?: Never AUDIT-C Score: 0 4. How often during the last year have you found that you were not able to stop drinking once you had started?: Never 5. How often during the last year have you failed to do what was normally expected from you because of drinking?: Never 6. How often during the last year have you needed a first drink in the morning to get yourself going after a heavy drinking session?: Never 7. How often during the last year have you had a feeling of guilt of remorse after drinking?: Never 8. How often during the last year have you been unable to remember what happened the night before because you had been drinking?: Never 9. Have you or someone else been injured as a result of your drinking?: No 10. Has a relative or friend or a doctor or another health worker been concerned about your drinking or suggested you cut down?: No Alcohol Use Disorder Identification Test Final Score (AUDIT): 0 Alcohol Brief Interventions/Follow-up: Alcohol education/Brief advice Substance Abuse History in the last 12 months:  No. Consequences of Substance Abuse: Negative Previous Psychotropic Medications: Yes  Psychological Evaluations: No  Past Medical History:  Past Medical History:  Diagnosis Date   Anemia    Anxiety    Depression    Ovarian cyst     Past Surgical History:  Procedure Laterality Date   ECT TREATMENTS     ROBOTIC ASSISTED BILATERAL SALPINGO OOPHERECTOMY Left 10/30/2020   Procedure: XI ROBOTIC ASSISTED SALPINGO OOPHORECTOMY;  Surgeon: Benjaman Kindler, MD;  Location: [facility] ORS;  Service: Gynecology;  Laterality: Left;   ROBOTIC ASSISTED LAPAROSCOPIC OVARIAN CYSTECTOMY Bilateral 10/30/2020   Procedure: XI ROBOTIC ASSISTED LAPAROSCOPIC OVARIAN CYSTECTOMY;  Surgeon: Benjaman Kindler, MD;  Location: [facility] ORS;  Service: Gynecology;  Laterality: Bilateral;   TUBAL LIGATION  2002   XI ROBOTIC ASSISTED SALPINGECTOMY Right 10/30/2020   Procedure: XI ROBOTIC ASSISTED SALPINGECTOMY;   Surgeon: Benjaman Kindler, MD;  Location: [facility] ORS;  Service: Gynecology;  Laterality: Right;   Family History:  Family History  Problem Relation Age of Onset   Breast cancer Neg Hx    Family Psychiatric  History: daughter with depression and anxiety Tobacco Screening:   Social History:  Social History   Substance and Sexual Activity  Alcohol Use Yes   Comment: occassional     Social History   Substance and Sexual Activity  Drug Use Never    Additional Social History:                           Allergies:  No Known Allergies Lab Results:  Results for orders placed or performed during the hospital encounter of 03/25/21 (from the past 48 hour(s))  Comprehensive metabolic panel     Status: Abnormal   Collection Time: 03/25/21  1:14 PM  Result Value Ref Range   Sodium 140 135 - 145 mmol/L   Potassium 4.1 3.5 - 5.1 mmol/L   Chloride 105 98 - 111 mmol/L   CO2 27 22 - 32 mmol/L   Glucose, Bld 113 (H) 70 - 99 mg/dL    Comment: Glucose reference range applies only to samples taken after fasting for at least 8 hours.  BUN 12 6 - 20 mg/dL   Creatinine, Ser 0.87 0.44 - 1.00 mg/dL   Calcium 8.9 8.9 - 10.3 mg/dL   Total Protein 7.6 6.5 - 8.1 g/dL   Albumin 4.4 3.5 - 5.0 g/dL   AST 13 (L) 15 - 41 U/L   ALT 13 0 - 44 U/L   Alkaline Phosphatase 54 38 - 126 U/L   Total Bilirubin 0.5 0.3 - 1.2 mg/dL   GFR, Estimated >60 >60 mL/min    Comment: (NOTE) Calculated using the CKD-EPI Creatinine Equation (2021)    Anion gap 8 5 - 15    Comment: Performed at [facility], [address] [place], [place] [address]  Ethanol     Status: None   Collection Time: 03/25/21  1:14 PM  Result Value Ref Range   Alcohol, Ethyl (B) <10 <10 mg/dL    Comment: (NOTE) Lowest detectable limit for serum alcohol is 10 mg/dL.  For medical purposes only. Performed at [facility], [address] [place], [place] [id number]   Salicylate level     Status: Abnormal    Collection Time: 03/25/21  1:14 PM  Result Value Ref Range   Salicylate Lvl Q000111Q (L) 7.0 - 30.0 mg/dL    Comment: Performed at [facility], [place], [place] [address]  Acetaminophen level     Status: Abnormal   Collection Time: 03/25/21  1:14 PM  Result Value Ref Range   Acetaminophen (Tylenol), Serum <10 (L) 10 - 30 ug/mL    Comment: (NOTE) Therapeutic concentrations vary significantly. A range of 10-30 ug/mL  may be an effective concentration for many patients. However, some  are best treated at concentrations outside of this range. Acetaminophen concentrations >150 ug/mL at 4 hours after ingestion  and >50 ug/mL at 12 hours after ingestion are often associated with  toxic reactions.  Performed at [facility], [address] [place], [place] [address]   cbc     Status: None   Collection Time: 03/25/21  1:14 PM  Result Value Ref Range   WBC 8.7 4.0 - 10.5 K/uL   RBC 4.50 3.87 - 5.11 MIL/uL   Hemoglobin 13.9 12.0 - 15.0 g/dL   HCT 40.0 36.0 - 46.0 %   MCV 88.9 80.0 - 100.0 fL   MCH 30.9 26.0 - 34.0 pg   MCHC 34.8 30.0 - 36.0 g/dL   RDW 12.1 11.5 - 15.5 %   Platelets 362 150 - 400 K/uL   nRBC 0.0 0.0 - 0.2 %    Comment: Performed at [facility], [address] [place], [place] [address]  Urine Drug Screen, Qualitative     Status: None   Collection Time: 03/25/21  1:14 PM  Result Value Ref Range   Tricyclic, Ur Screen NONE DETECTED NONE DETECTED   Amphetamines, Ur Screen NONE DETECTED NONE DETECTED   MDMA (Ecstasy)Ur Screen NONE DETECTED NONE DETECTED   Cocaine Metabolite,Ur [place] NONE DETECTED NONE DETECTED   Opiate, Ur Screen NONE DETECTED NONE DETECTED   Phencyclidine (PCP) Ur S NONE DETECTED NONE DETECTED   Cannabinoid 50 Ng, Ur [place] NONE DETECTED NONE DETECTED   Barbiturates, Ur Screen NONE DETECTED NONE DETECTED   Benzodiazepine, Ur Scrn NONE DETECTED NONE DETECTED   Methadone Scn, Ur NONE DETECTED NONE DETECTED     Comment: (NOTE) Tricyclics + metabolites, urine    Cutoff 1000 ng/mL Amphetamines + metabolites, urine  Cutoff 1000 ng/mL MDMA (Ecstasy), urine  Cutoff 500 ng/mL Cocaine Metabolite, urine          Cutoff 300 ng/mL Opiate + metabolites, urine        Cutoff 300 ng/mL Phencyclidine (PCP), urine         Cutoff 25 ng/mL Cannabinoid, urine                 Cutoff 50 ng/mL Barbiturates + metabolites, urine  Cutoff 200 ng/mL Benzodiazepine, urine              Cutoff 200 ng/mL Methadone, urine                   Cutoff 300 ng/mL  The urine drug screen provides only a preliminary, unconfirmed analytical test result and should not be used for non-medical purposes. Clinical consideration and professional judgment should be applied to any positive drug screen result due to possible interfering substances. A more specific alternate chemical method must be used in order to obtain a confirmed analytical result. Gas chromatography / mass spectrometry (GC/MS) is the preferred confirm atory method. Performed at [facility], [address] [place], [place] [address]   Pregnancy, urine     Status: None   Collection Time: 03/25/21  1:14 PM  Result Value Ref Range   Preg Test, Ur NEGATIVE NEGATIVE    Comment: Performed at [facility], [address] [place], [place] [address]  Resp Panel by RT-PCR (Flu A&B, Covid) Nasopharyngeal Swab     Status: None   Collection Time: 03/25/21  8:09 PM   Specimen: Nasopharyngeal Swab; Nasopharyngeal(NP) swabs in vial transport medium  Result Value Ref Range   SARS Coronavirus 2 by RT PCR NEGATIVE NEGATIVE    Comment: (NOTE) SARS-CoV-2 target nucleic acids are NOT DETECTED.  The SARS-CoV-2 RNA is generally detectable in upper respiratory specimens during the acute phase of infection. The lowest concentration of SARS-CoV-2 viral copies this assay can detect is 138 copies/mL. A negative result does not preclude SARS-Cov-2 infection  and should not be used as the sole basis for treatment or other patient management decisions. A negative result may occur with  improper specimen collection/handling, submission of specimen other than nasopharyngeal swab, presence of viral mutation(s) within the areas targeted by this assay, and inadequate number of viral copies(<138 copies/mL). A negative result must be combined with clinical observations, patient history, and epidemiological information. The expected result is Negative.  Fact Sheet for Patients:  EntrepreneurPulse.com.au  Fact Sheet for Healthcare Providers:  IncredibleEmployment.be  This test is no t yet approved or cleared by the [place] FDA and  has been authorized for detection and/or diagnosis of SARS-CoV-2 by FDA under an Emergency Use Authorization (EUA). This EUA will remain  in effect (meaning this test can be used) for the duration of the COVID-19 declaration under Section 564(b)(1) of the Act, 21 U.S.C.section 360bbb-3(b)(1), unless the authorization is terminated  or revoked sooner.       Influenza A by PCR NEGATIVE NEGATIVE   Influenza B by PCR NEGATIVE NEGATIVE    Comment: (NOTE) The Xpert Xpress SARS-CoV-2/FLU/RSV plus assay is intended as an aid in the diagnosis of influenza from Nasopharyngeal swab specimens and should not be used as a sole basis for treatment. Nasal washings and aspirates are unacceptable for Xpert Xpress SARS-CoV-2/FLU/RSV testing.  Fact Sheet for Patients: EntrepreneurPulse.com.au  Fact Sheet for Healthcare Providers: IncredibleEmployment.be  This test is not yet approved or cleared by the [place] and has been authorized  for detection and/or diagnosis of SARS-CoV-2 by FDA under an Emergency Use Authorization (EUA). This EUA will remain in effect (meaning this test can be used) for the duration of the COVID-19 declaration under  Section 564(b)(1) of the Act, 21 U.S.C. section 360bbb-3(b)(1), unless the authorization is terminated or revoked.  Performed at [facility], [address] [place], [place] [address]     Blood Alcohol level:  Lab Results  Component Value Date   [facility] <10 Q000111Q    Metabolic Disorder Labs:  No results found for: HGBA1C, MPG No results found for: PROLACTIN No results found for: CHOL, TRIG, HDL, CHOLHDL, VLDL, LDLCALC  Current Medications: Current Facility-Administered Medications  Medication Dose Route Frequency Provider Last Rate Last Admin   acetaminophen (TYLENOL) tablet 650 mg  650 mg Oral Q6H PRN Deloria Lair, NP       alum & mag hydroxide-simeth (MAALOX/MYLANTA) 200-200-20 MG/5ML suspension 30 mL  30 mL Oral Q4H PRN Deloria Lair, NP   30 mL at 03/26/21 0143   ARIPiprazole (ABILIFY) tablet 5 mg  5 mg Oral QHS Salley Scarlet, MD       buPROPion (WELLBUTRIN XL) 24 hr tablet 300 mg  300 mg Oral Daily Doren Custard, Rashaun M, NP   300 mg at 03/26/21 0815   hydrOXYzine (ATARAX/VISTARIL) tablet 25 mg  25 mg Oral TID PRN Deloria Lair, NP       magnesium hydroxide (MILK OF MAGNESIA) suspension 30 mL  30 mL Oral Daily PRN Deloria Lair, NP       traZODone (DESYREL) tablet 50 mg  50 mg Oral QHS PRN Deloria Lair, NP       vitamin B-12 (CYANOCOBALAMIN) tablet 1,000 mcg  1,000 mcg Oral Daily Doren Custard, Rashaun M, NP   1,000 mcg at 03/26/21 0815   PTA Medications: Medications Prior to Admission  Medication Sig Dispense Refill Last Dose   ARIPiprazole (ABILIFY) 5 MG tablet Take 2.5 mg by mouth at bedtime.      buPROPion (WELLBUTRIN SR) 150 MG 12 hr tablet Take 300 mg by mouth daily.      docusate sodium (COLACE) 100 MG capsule Take 1 capsule (100 mg total) by mouth 2 (two) times daily. To keep stools soft (Patient not taking: Reported on 03/25/2021) 30 capsule 0    ferrous sulfate 325 (65 FE) MG EC tablet Take 325 mg by mouth in the morning and at bedtime. (Patient  not taking: Reported on 03/25/2021)      gabapentin (NEURONTIN) 800 MG tablet Take 1 tablet (800 mg total) by mouth at bedtime for 14 days. Take nightly for 3 days, then up to 14 days as needed 14 tablet 0    ibuprofen (ADVIL) 200 MG tablet Take 400 mg by mouth every 6 (six) hours as needed for moderate pain.      propranolol (INDERAL) 10 MG tablet Take 10 mg by mouth 2 (two) times daily as needed. (Patient not taking: Reported on 03/25/2021)      Trypsin-Castor Oil-[place] Balsam (OPTASE EX) Place 1 drop into both eyes at bedtime as needed (dry/irritated eyes).      vitamin B-12 (CYANOCOBALAMIN) 1000 MCG tablet Take 1,000 mcg by mouth daily. (Patient not taking: Reported on 03/25/2021)       Musculoskeletal: Strength & Muscle Tone: within normal limits Gait & Station: normal Patient leans: N/A            Psychiatric Specialty Exam:  Presentation  General Appearance:  Appropriate  for Environment; Casual Eye Contact: Fair Speech: Normal Rate; Clear and Coherent Speech Volume: Normal Handedness: Right  Mood and Affect  Mood: Anxious; Depressed Affect: Congruent  Thought Process  Thought Processes: Coherent; Goal Directed Duration of Psychotic Symptoms: N/A Past Diagnosis of Schizophrenia or Psychoactive disorder: No  Descriptions of Associations:Intact Orientation:Full (Time, Place and Person) Thought Content:Paranoid Ideation Hallucinations:Hallucinations: None Ideas of Reference:None Suicidal Thoughts:Suicidal Thoughts: No Homicidal Thoughts:Homicidal Thoughts: No  Sensorium  Memory: Immediate Fair; Recent Fair; Remote Fair Judgment: Intact Insight: Present  Executive Functions  Concentration: Fair Attention Span: Fair Recall: [employer] of Knowledge: Fair Language: Fair  Psychomotor Activity  Psychomotor Activity: Psychomotor Activity: Normal  Assets  Assets: Communication Skills; Desire for Improvement; Financial Resources/Insurance;  Housing; Intimacy; Resilience; Social Support; Talents/Skills  Sleep  Sleep: Sleep: Fair   Physical Exam: Physical Exam Vitals and nursing note reviewed.  Constitutional:      Appearance: Normal appearance.  HENT:     Head: Normocephalic and atraumatic.     Right Ear: External ear normal.     Left Ear: External ear normal.     Nose: Nose normal.     Mouth/Throat:     Mouth: Mucous membranes are moist.     Pharynx: Oropharynx is clear.  Eyes:     Extraocular Movements: Extraocular movements intact.     Conjunctiva/sclera: Conjunctivae normal.     Pupils: Pupils are equal, round, and reactive to light.  Cardiovascular:     Rate and Rhythm: Normal rate.     Pulses: Normal pulses.  Pulmonary:     Effort: Pulmonary effort is normal.     Breath sounds: Normal breath sounds.  Abdominal:     General: Abdomen is flat.     Palpations: Abdomen is soft.  Musculoskeletal:        General: No swelling. Normal range of motion.     Cervical back: Normal range of motion and neck supple.  Skin:    General: Skin is warm and dry.  Neurological:     General: No focal deficit present.     Mental Status: She is alert and oriented to person, place, and time.  Psychiatric:        Attention and Perception: Attention normal.        Mood and Affect: Mood is anxious and depressed.        Speech: Speech normal.        Behavior: Behavior is cooperative.        Thought Content: Thought content is paranoid.        Cognition and Memory: Cognition is impaired. Memory is impaired.        Judgment: Judgment is impulsive.   Review of Systems  Constitutional: Negative.   HENT: Negative.    Eyes: Negative.   Respiratory: Negative.    Cardiovascular: Negative.   Gastrointestinal: Negative.   Genitourinary: Negative.   Musculoskeletal: Negative.   Skin: Negative.   Neurological: Negative.   Endo/Heme/Allergies: Negative.   Psychiatric/Behavioral:  Positive for depression, hallucinations and  suicidal ideas. Negative for memory loss and substance abuse. The patient is nervous/anxious and has insomnia.   Blood pressure 133/90, pulse 67, temperature 98 F (36.7 C), temperature source Oral, resp. rate 18, height '5\' 4"'$  (1.626 m), weight 69.4 kg, last menstrual period 03/04/2021, SpO2 98 %. Body mass index is 26.26 kg/m.  Treatment Plan Summary: Daily contact with patient to assess and evaluate symptoms and progress in treatment and Medication management 45 year old female presenting for worsening  depression, paranoia, and suicidal ideations in the context of stopping medications. Today patient denies any current suicidal ideations, and notes that paranoia is slowly improving since resuming Abilify. Continue Wellbutrin 300 mg daily, increase Abilify to 5 mg daily.   Observation Level/Precautions:  15 minute checks  Laboratory:   lipid panel, hemoglobin a1c  Psychotherapy:    Medications:    Consultations:    Discharge Concerns:    Estimated LOS:  Other:     Physician Treatment Plan for Primary Diagnosis: MDD (major depressive disorder), recurrent, severe, with psychosis ([place]) Long Term Goal(s): Improvement in symptoms so as ready for discharge  Short Term Goals: Ability to identify changes in lifestyle to reduce recurrence of condition will improve, Ability to verbalize feelings will improve, Ability to disclose and discuss suicidal ideas, Ability to demonstrate self-control will improve, Ability to identify and develop effective coping behaviors will improve, Ability to maintain clinical measurements within normal limits will improve, Compliance with prescribed medications will improve, and Ability to identify triggers associated with substance abuse/mental health issues will improve  Physician Treatment Plan for Secondary Diagnosis: Principal Problem:   MDD (major depressive disorder), recurrent, severe, with psychosis ([place])  Long Term Goal(s): Improvement in symptoms so as ready  for discharge  Short Term Goals: Ability to identify changes in lifestyle to reduce recurrence of condition will improve, Ability to verbalize feelings will improve, Ability to disclose and discuss suicidal ideas, Ability to demonstrate self-control will improve, Ability to identify and develop effective coping behaviors will improve, Ability to maintain clinical measurements within normal limits will improve, Compliance with prescribed medications will improve, and Ability to identify triggers associated with substance abuse/mental health issues will improve  I certify that inpatient services furnished can reasonably be expected to improve the patient's condition.    Salley Scarlet, MD 8/29/20222:16 PM

## 2021-03-26 NOTE — Progress Notes (Signed)
Patient presents with flat affect but brightens on approach. Denies SI, HI, AVH. Medication compliant. Spent most of night on phone. No distress noted. Pt reports ready to discharge and will continue to take medications. Encouragement and support provided. Safety checks maintained. Medications given as prescribed. Pt receptive and remains safe on unit with q 15 min checks.

## 2021-03-26 NOTE — BHH Counselor (Signed)
Adult Comprehensive Assessment  Patient ID: Leslie Elliott, female   DOB: November 11, 1975, 45 y.o.   MRN: [id number]  Information Source: Information source: Patient  Current Stressors:  Patient states their primary concerns and needs for treatment are:: "I think I just got overwhelmed, a lot of things changing. I took myself off my medications without letting my doctor know." Patient states their goals for this hospitilization and ongoing recovery are:: "To go home, to feel better enough to go home." Educational / Learning stressors: None identified Employment / Job issues: On disability Family Relationships: Worries about her Therapist, nutritional / Lack of resources (include bankruptcy): rising cost of living Housing / Lack of housing: Stable housing Physical health (include injuries & life threatening diseases): None identified Social relationships: Reports some social anxiety Substance abuse: Pt denies Bereavement / Loss: None reported  Living/Environment/Situation:  Living Arrangements: Spouse/significant other, Children Living conditions (as described by patient or guardian): "Really nice, neighbors nice and friendly." Who else lives in the home?: "My husband and oldest and youngest children." How long has patient lived in current situation?: "[place] last year and moved into new house in January." What is atmosphere in current home: Comfortable, Quarry manager, Supportive  Family History:  Marital status: Married Number of Years Married: 25 (She states it will be 25 years in October.) What types of issues is patient dealing with in the relationship?: None reported Additional relationship information: None reported Are you sexually active?: Yes What is your sexual orientation?: Heterosexual Has your sexual activity been affected by drugs, alcohol, medication, or emotional stress?: N/A Does patient have children?: Yes How many children?: 3 How is patient's relationship with their  children?: "Good"  Childhood History:  By whom was/is the patient raised?: Mother/father and step-parent Additional childhood history information: She reports she was raised by both parents until she was 86 or [age] years of age when they divorced. She states that she lived with her mother and stepfather and went to father and stepmother's every other weekend. Description of patient's relationship with caregiver when they were a child: "Good" Patient's description of current relationship with people who raised him/her: "Good" How were you disciplined when you got in trouble as a child/adolescent?: "Occasional spanking" Does patient have siblings?: Yes Number of Siblings: 2 (Pt is the middle child) Description of patient's current relationship with siblings: "Good" Did patient suffer any verbal/emotional/physical/sexual abuse as a child?: No Did patient suffer from severe childhood neglect?: No Has patient ever been sexually abused/assaulted/raped as an adolescent or adult?: No Was the patient ever a victim of a crime or a disaster?: No Witnessed domestic violence?: No Has patient been affected by domestic violence as an adult?: No  Education:  Highest grade of school patient has completed: High school graduate with some college. Currently a student?: No Learning disability?: No  Employment/Work Situation:   Employment Situation: On disability Why is Patient on Disability: Mental health How Long has Patient Been on Disability: "Since 2007, 2008" Patient's Job has Been Impacted by Current Illness: No What is the Longest Time Patient has Held a Job?: Ten years Where was the Patient Employed at that Time?: She reports working part-time at [employer]. Has Patient ever Been in the [employer]?: No  Financial Resources:   Financial resources: [employer], Medicare Does patient have a Programmer, applications or guardian?: No  Alcohol/Substance Abuse:   What has been your use of drugs/alcohol  within the last 12 months?: Pt denies If attempted suicide, did drugs/alcohol play  a role in this?:  (N/A) Alcohol/Substance Abuse Treatment Hx: Denies past history If yes, describe treatment: N/A Has alcohol/substance abuse ever caused legal problems?: No  Social Support System:   Patient's Community Support System: Good ("Really good.") Describe Community Support System: "My family, parents, grandparents, husband, children, aunts, uncles, in-laws..." Type of faith/religion: Darrick Meigs How does patient's faith help to cope with current illness?: "Pray, go to church."  Leisure/Recreation:   Do You Have Hobbies?: Yes Leisure and Hobbies: "Play some games on tablet, Bible app for bible study with my family, read books (book studies with friends)."  Strengths/Needs:   What is the patient's perception of their strengths?: "I guess I love the Lord well and my family." Patient states they can use these personal strengths during their treatment to contribute to their recovery: Unable to assess Patient states these barriers may affect/interfere with their treatment: Pt denies any Patient states these barriers may affect their return to the community: Pt denies Other important information patient would like considered in planning for their treatment: N/A  Discharge Plan:   Currently receiving community mental health services: Yes (From Whom) (She reports that her provider is retiring in October.) Patient states concerns and preferences for aftercare planning are: She states that her provider gave her a list of other doctors who pt could try to see. Pt states that she needs to go over this with her husband and then make a decision. Patient states they will know when they are safe and ready for discharge when: "I feel like I have (met my goals)." Does patient have access to transportation?: Yes Does patient have financial barriers related to discharge medications?: No Patient description of barriers  related to discharge medications: N/A Will patient be returning to same living situation after discharge?: Yes  Summary/Recommendations:   Summary and Recommendations (to be completed by the evaluator): Patient is a 44 year old, married, mother of three from [place], [place] [facility] She presented to ED due to suicidal ideation, worsening depression, and paranoia in the context of stopping her medication. Pt expressed desire to feel better enough to go home. She denies any substance use or history of trauma. Pt lives at home with her husband and two of her adult children (the oldest and youngest), which is where she plans to return upon discharge. She endorsed some stress around worrying about her children, her oldest getting a job and the middle child moving out with her own family; her psych provider retiring; recent move into a new house; lack of concentration; and thoughts which overwhelm her. Pt stated that she sees Dr. Doy Hutching for psych services, but this will in October with the provider retires. She expressed some interest in a new provider but stated that Dr. Doy Hutching had given her a list of providers that her and her husband needed to look through before deciding. CSW stated that this was ok, and the authorization was left blank until pt is ready to complete. Pt has been on disability since 2007 or 2008, has Medicare, in addition to insurance through her husband. 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 Recommendations include crisis stabilization, therapeutic milieu, encourage group attendance and participation, medication management for mood stabilization, and development of comprehensive mental wellness plan.  Shirl Harris. 03/26/2021

## 2021-03-27 DIAGNOSIS — F333 Major depressive disorder, recurrent, severe with psychotic symptoms: Secondary | ICD-10-CM | POA: Diagnosis not present

## 2021-03-27 LAB — LIPID PANEL
Cholesterol: 162 mg/dL (ref 0–200)
HDL: 34 mg/dL — ABNORMAL LOW (ref >40)
LDL Cholesterol: 96 mg/dL (ref 0–99)
Total CHOL/HDL Ratio: 4.8 RATIO
Triglycerides: 160 mg/dL — ABNORMAL HIGH (ref <150)
VLDL: 32 mg/dL (ref 0–40)

## 2021-03-27 NOTE — Plan of Care (Signed)
Pt rates depression and anxiety both 1/10. Pt denies SI, HI and AVH. Pt was educated on care plan and verbalizes understanding. Collier Bullock RN Problem: Education: Goal: Knowledge of  General Education information/materials will improve Outcome: Progressing Goal: Emotional status will improve Outcome: Progressing Goal: Mental status will improve Outcome: Progressing Goal: Verbalization of understanding the information provided will improve Outcome: Progressing   Problem: Activity: Goal: Interest or engagement in activities will improve Outcome: Progressing Goal: Sleeping patterns will improve Outcome: Progressing   Problem: Coping: Goal: Ability to verbalize frustrations and anger appropriately will improve Outcome: Progressing Goal: Ability to demonstrate self-control will improve Outcome: Progressing   Problem: Health Behavior/Discharge Planning: Goal: Identification of resources available to assist in meeting health care needs will improve Outcome: Progressing Goal: Compliance with treatment plan for underlying cause of condition will improve Outcome: Progressing   Problem: Physical Regulation: Goal: Ability to maintain clinical measurements within normal limits will improve Outcome: Progressing   Problem: Safety: Goal: Periods of time without injury will increase Outcome: Progressing   Problem: Education: Goal: Utilization of techniques to improve thought processes will improve Outcome: Progressing Goal: Knowledge of the prescribed therapeutic regimen will improve Outcome: Progressing   Problem: Activity: Goal: Interest or engagement in leisure activities will improve Outcome: Progressing Goal: Imbalance in normal sleep/wake cycle will improve Outcome: Progressing   Problem: Coping: Goal: Coping ability will improve Outcome: Progressing Goal: Will verbalize feelings Outcome: Progressing   Problem: Health Behavior/Discharge Planning: Goal: Ability  to make decisions will improve Outcome: Progressing Goal: Compliance with therapeutic regimen will improve Outcome: Progressing   Problem: Role Relationship: Goal: Will demonstrate positive changes in social behaviors and relationships Outcome: Progressing   Problem: Safety: Goal: Ability to disclose and discuss suicidal ideas will improve Outcome: Progressing Goal: Ability to identify and utilize support systems that promote safety will improve Outcome: Progressing   Problem: Self-Concept: Goal: Will verbalize positive feelings about self Outcome: Progressing Goal: Level of anxiety will decrease Outcome: Progressing

## 2021-03-27 NOTE — Progress Notes (Signed)
Pt denies depression, SI, HI and AVH. Pt said that she had "some" anxiety during group when she thought about having to speak in group. She is hopeful of the future and looks brighter. She has been out of her room and appears to be somewhat social. She is appropriate, calm and cooperative. Collier Bullock RN

## 2021-03-27 NOTE — Group Note (Signed)
[facility] LCSW Group Therapy Note   Group Date: 03/27/2021 Start Time: 1:00PM End Time: 2:15PM  Type of Therapy/Topic:  Group Therapy:  Feelings about Diagnosis  Participation Level:  None   Description of Group:    This group will allow patients to explore their thoughts and feelings about diagnoses they have received. Patients will be guided to explore their level of understanding and acceptance of these diagnoses. Facilitator will encourage patients to process their thoughts and feelings about the reactions of others to their diagnosis, and will guide patients in identifying ways to discuss their diagnosis with significant others in their lives. This group will be process-oriented, with patients participating in exploration of their own experiences as well as giving and receiving support and challenge from other group members.   Therapeutic Goals: 1. Patient will demonstrate understanding of diagnosis as evidence by identifying two or more symptoms of the disorder:  2. Patient will be able to express two feelings regarding the diagnosis 3. Patient will demonstrate ability to communicate their needs through discussion and/or role plays  Summary of Patient Progress: Patient was present in group, though did not engage in discussion.  Patient was attentive throughout.    Therapeutic Modalities:   Cognitive Behavioral Therapy Brief Therapy Feelings Identification    Rozann Lesches, LCSW

## 2021-03-27 NOTE — Progress Notes (Signed)
[facility] MD Progress Note  03/27/2021 9:59 AM Leslie Elliott  MRN:  [id number]  CC "I'm okay."  Subjective:  45 year old female presenting for worsening depression, paranoia, and suicidal ideations in the context of stopping medications. No acute events overnight, medication compliant, attending to ADLs. Patient seen one-on-one this morning. She notes that she is tired an her back is sore secondary to mattresses, and felt somewhat dizzy this morning. Aside from this, no other complaints. She notes that she has felt an overwhelming sense of calmness and peace that she attributes to prayers from her family and church. When asked again about her suicidal statements this weekend, she continues to express that she just had a spiritual demon that took hold. She is vague and superficial in her discussion about these events, but does deny feelings of battling with demonic thoughts today. She denies current suicidal ideations, homicidal ideations, visual hallucinations, and auditory hallucinations. She is agreeable to [place] setting up an appointment with a new outpatient psychiatrist as hers has retired.   Principal Problem: MDD (major depressive disorder), recurrent, severe, with psychosis ([place]) Diagnosis: Principal Problem:   MDD (major depressive disorder), recurrent, severe, with psychosis ([place])  Total Time spent with patient: 30 minutes  Past Psychiatric History: See H&P  Past Medical History:  Past Medical History:  Diagnosis Date   Anemia    Anxiety    Depression    Ovarian cyst     Past Surgical History:  Procedure Laterality Date   ECT TREATMENTS     ROBOTIC ASSISTED BILATERAL SALPINGO OOPHERECTOMY Left 10/30/2020   Procedure: XI ROBOTIC ASSISTED SALPINGO OOPHORECTOMY;  Surgeon: Benjaman Kindler, MD;  Location: [facility] ORS;  Service: Gynecology;  Laterality: Left;   ROBOTIC ASSISTED LAPAROSCOPIC OVARIAN CYSTECTOMY Bilateral 10/30/2020   Procedure: XI ROBOTIC ASSISTED LAPAROSCOPIC OVARIAN CYSTECTOMY;   Surgeon: Benjaman Kindler, MD;  Location: [facility] ORS;  Service: Gynecology;  Laterality: Bilateral;   TUBAL LIGATION  2002   XI ROBOTIC ASSISTED SALPINGECTOMY Right 10/30/2020   Procedure: XI ROBOTIC ASSISTED SALPINGECTOMY;  Surgeon: Benjaman Kindler, MD;  Location: [facility] ORS;  Service: Gynecology;  Laterality: Right;   Family History:  Family History  Problem Relation Age of Onset   Breast cancer Neg Hx    Family Psychiatric  History: See H&P Social History:  Social History   Substance and Sexual Activity  Alcohol Use Yes   Comment: occassional     Social History   Substance and Sexual Activity  Drug Use Never    Social History   Socioeconomic History   Marital status: Married    Spouse name: Herbie [place]   Number of children: Not on file   Years of education: Not on file   Highest education level: Not on file  Occupational History   Not on file  Tobacco Use   Smoking status: Never   Smokeless tobacco: Never  Vaping Use   Vaping Use: Never used  Substance and Sexual Activity   Alcohol use: Yes    Comment: occassional   Drug use: Never   Sexual activity: Not on file  Other Topics Concern   Not on file  Social History Narrative   Not on file   Social Determinants of Health   Financial Resource Strain: Not on file  Food Insecurity: Not on file  Transportation Needs: Not on file  Physical Activity: Not on file  Stress: Not on file  Social Connections: Not on file   Additional Social History:  Sleep: Fair  Appetite:  Fair  Current Medications: Current Facility-Administered Medications  Medication Dose Route Frequency Provider Last Rate Last Admin   acetaminophen (TYLENOL) tablet 650 mg  650 mg Oral Q6H PRN Dixon, Ernst Bowler, NP       alum & mag hydroxide-simeth (MAALOX/MYLANTA) 200-200-20 MG/5ML suspension 30 mL  30 mL Oral Q4H PRN Deloria Lair, NP   30 mL at 03/26/21 0143   ARIPiprazole (ABILIFY) tablet 5 mg  5 mg Oral QHS  Salley Scarlet, MD   5 mg at 03/26/21 2111   buPROPion (WELLBUTRIN XL) 24 hr tablet 300 mg  300 mg Oral Daily Anette Riedel M, NP   300 mg at 03/27/21 [id number]   hydrOXYzine (ATARAX/VISTARIL) tablet 25 mg  25 mg Oral TID PRN Deloria Lair, NP       magnesium hydroxide (MILK OF MAGNESIA) suspension 30 mL  30 mL Oral Daily PRN Deloria Lair, NP       traZODone (DESYREL) tablet 50 mg  50 mg Oral QHS PRN Deloria Lair, NP   50 mg at 03/26/21 2111   vitamin B-12 (CYANOCOBALAMIN) tablet 1,000 mcg  1,000 mcg Oral Daily Deloria Lair, NP   1,000 mcg at 03/27/21 [id number]    Lab Results:  Results for orders placed or performed during the hospital encounter of 03/26/21 (from the past 48 hour(s))  Lipid panel     Status: Abnormal   Collection Time: 03/27/21  6:26 AM  Result Value Ref Range   Cholesterol 162 0 - 200 mg/dL   Triglycerides 160 (H) <150 mg/dL   HDL 34 (L) >40 mg/dL   Total CHOL/HDL Ratio 4.8 RATIO   VLDL 32 0 - 40 mg/dL   LDL Cholesterol 96 0 - 99 mg/dL    Comment:        Total Cholesterol/HDL:CHD Risk Coronary Heart Disease Risk Table                     Men   Women  1/2 Average Risk   3.4   3.3  Average Risk       5.0   4.4  2 X Average Risk   9.6   7.1  3 X Average Risk  23.4   11.0        Use the calculated Patient Ratio above and the CHD Risk Table to determine the patient's CHD Risk.        ATP III CLASSIFICATION (LDL):  <100     mg/dL   Optimal  100-129  mg/dL   Near or Above                    Optimal  130-159  mg/dL   Borderline  160-189  mg/dL   High  >190     mg/dL   Very High Performed at [facility], [address] [place], [place] [address]     Blood Alcohol level:  Lab Results  Component Value Date   [facility] <10 [id number]    Metabolic Disorder Labs: No results found for: HGBA1C, MPG No results found for: PROLACTIN Lab Results  Component Value Date   CHOL 162 03/27/2021   TRIG 160 (H) 03/27/2021   HDL 34 (L) 03/27/2021   CHOLHDL  4.8 03/27/2021   VLDL 32 03/27/2021   LDLCALC 96 03/27/2021    Physical Findings: AIMS:  , ,  ,  ,    CIWA:    COWS:  Musculoskeletal: Strength & Muscle Tone: within normal limits Gait & Station: normal Patient leans: N/A  Psychiatric Specialty Exam:  Presentation  General Appearance: Appropriate for Environment; Casual  Eye Contact:Fair  Speech:Clear and Coherent; Normal Rate  Speech Volume:Normal  Handedness:Right   Mood and Affect  Mood:Anxious  Affect:Congruent   Thought Process  Thought Processes:Coherent  Descriptions of Associations:Intact  Orientation:Full (Time, Place and Person)  Thought Content:Paranoid Ideation  History of Schizophrenia/Schizoaffective disorder:No  Duration of Psychotic Symptoms:N/A  Hallucinations:Hallucinations: None  Ideas of Reference:Paranoia  Suicidal Thoughts:Suicidal Thoughts: No  Homicidal Thoughts:Homicidal Thoughts: No   Sensorium  Memory:Immediate Fair; Recent Fair; Remote Fair  Judgment:Intact  Insight:Present   Executive Functions  Concentration:Fair  Attention Span:Fair  [place]   Psychomotor Activity  Psychomotor Activity:Psychomotor Activity: Normal   Assets  Assets:Communication Skills; Desire for Improvement; Financial Resources/Insurance; Housing; Intimacy; Physical Health; Resilience; Social Support   Sleep  Sleep:Sleep: Fair    Physical Exam: Physical Exam ROS Blood pressure (!) 90/58, pulse (!) 54, temperature 98.4 F (36.9 C), temperature source Oral, resp. rate 18, height '5\' 4"'$  (1.626 m), weight 69.4 kg, last menstrual period 03/04/2021, SpO2 97 %. Body mass index is 26.26 kg/m.   Treatment Plan Summary: Daily contact with patient to assess and evaluate symptoms and progress in treatment and Medication management  45 year old female presenting for worsening depression, paranoia, and suicidal ideations in the context of  stopping medications. Today patient denies any current suicidal ideations, and notes that paranoia is slowly improving since resuming Abilify. Continue Wellbutrin 300 mg daily, Abilify to 5 mg daily.   Salley Scarlet, MD 03/27/2021, 9:59 AM

## 2021-03-27 NOTE — Progress Notes (Addendum)
Recreation Therapy Notes  Date: 03/27/2021  Time: 9:45 am   Location: Courtyard  Behavioral response: Appropriate  Intervention Topic: Leisure   Discussion/Intervention:  Group content today was focused on leisure. The group defined what leisure is and some positive leisure activities they participate in. Individuals identified the difference between good and bad leisure. Participants expressed how they feel after participating in the leisure of their choice. The group discussed how they go about picking a leisure activity and if others are involved in their leisure activities. The patient stated how many leisure activities they have to choose from and reasons why it is important to have leisure time. Individuals participated in the intervention "Exploration of Leisure" where they had a chance to identify new leisure activities as well as benefits of leisure. Clinical Observations/Feedback: Patient came to group and was active while participating in leisure. Individual was social with peers and staff while participating in the intervention.  Mercie Balsley LRT/CTRS         [place] Dominique 03/27/2021 11:43 AM

## 2021-03-28 DIAGNOSIS — F333 Major depressive disorder, recurrent, severe with psychotic symptoms: Secondary | ICD-10-CM | POA: Diagnosis not present

## 2021-03-28 LAB — HEMOGLOBIN A1C
Hgb A1c MFr Bld: 5.2 % (ref 4.8–5.6)
Mean Plasma Glucose: 103 mg/dL

## 2021-03-28 MED ORDER — BUPROPION HCL ER (XL) 300 MG PO TB24: 300.0000 mg | ORAL_TABLET | Freq: Every day | ORAL | 1 refills | Status: DC

## 2021-03-28 MED ORDER — ARIPIPRAZOLE 5 MG PO TABS: 5.0000 mg | ORAL_TABLET | Freq: Every day | ORAL | 1 refills | Status: DC

## 2021-03-28 NOTE — Progress Notes (Signed)
  [facility] Adult Case Management Discharge Plan :  Will you be returning to the same living situation after discharge:  Yes,  pt plans to return home. At discharge, do you have transportation home?: Yes,  pt husband to provide transportation home.  Do you have the ability to pay for your medications: Yes,  [employer].  Release of information consent forms completed and in the chart;  Patient's signature needed at discharge.  Patient to Follow up at:  Follow-up Information     [facility]. Call.   Specialty: Behavioral Health Why: Your patient referral has been sent to [facility]. Please contact [place] office to set up your appointment with a psychiatrist. Thanks! Contact information: [place] [phone]                Next level of care provider has access to [place] and Suicide Prevention discussed: Yes,  SPE completed with Dorice Lamas, husband.     Has patient been referred to the Quitline?: Patient refused referral  Patient has been referred for addiction treatment: Pt. refused referral  Shirl Harris, LCSW 03/28/2021, 10:46 AM

## 2021-03-28 NOTE — Progress Notes (Signed)
Recreation Therapy Notes   Date: 03/28/2021  Time: 9:45 am   Location: Craft room   Behavioral response: Appropriate  Intervention Topic: Stress Management   Discussion/Intervention:  Group content on today was focused on stress. The group defined stress and way to cope with stress. Participants expressed how they know when they are stresses out. Individuals described the different ways they have to cope with stress. The group stated reasons why it is important to cope with stress. Patient explained what good stress is and some examples. The group participated in the intervention "Stress Management". Individuals were separated into two group and answered questions related to stress.  Clinical Observations/Feedback: Patient came to group and was focused on what peers and staff had to say about stress.  Individual was social with peers and staff while participating in the intervention.  Leslie Elliott LRT/CTRS         Keyari Kleeman 03/28/2021 11:31 AM

## 2021-03-28 NOTE — [facility] Suicide Risk Assessment (Signed)
[facility] Discharge Suicide Risk Assessment   Principal Problem: MDD (major depressive disorder), recurrent, severe, with psychosis ([place]) Discharge Diagnoses: Principal Problem:   MDD (major depressive disorder), recurrent, severe, with psychosis ([place])   Total Time spent with patient: 35 minutes- 25 minutes face-to-face contact with patient, 10 minutes documentation, coordination of care, scripts   Musculoskeletal: Strength & Muscle Tone: within normal limits Gait & Station: normal Patient leans: N/A  Psychiatric Specialty Exam  Presentation  General Appearance: Appropriate for Environment  Eye Contact:Good  Speech:Clear and Coherent; Normal Rate  Speech Volume:Normal  Handedness:Right   Mood and Affect  Mood:Euthymic  Duration of Depression Symptoms: Greater than two weeks  Affect:Congruent   Thought Process  Thought Processes:Coherent; Goal Directed  Descriptions of Associations:Intact  Orientation:Full (Time, Place and Person)  Thought Content:Logical  History of Schizophrenia/Schizoaffective disorder:No  Duration of Psychotic Symptoms:N/A  Hallucinations:Hallucinations: None  Ideas of Reference:None  Suicidal Thoughts:Suicidal Thoughts: No  Homicidal Thoughts:Homicidal Thoughts: No   Sensorium  Memory:Immediate Fair; Recent Fair; Remote Fair  Judgment:Intact  Insight:Present   Executive Functions  Concentration:Good  Attention Span:Good  [place] of Knowledge:Good  Language:Good   Psychomotor Activity  Psychomotor Activity:Psychomotor Activity: Normal   Assets  Assets:Communication Skills; Desire for Improvement; Financial Resources/Insurance; Housing; Intimacy; Leisure Time; Physical Health; Resilience; Social Support; Talents/Skills; Transportation; Vocational/Educational   Sleep  Sleep:Sleep: Good Number of Hours of Sleep: 7.5   Physical Exam: Physical Exam ROS Blood pressure 94/65, pulse 60, temperature 98 F  (36.7 C), temperature source Oral, resp. rate 17, height '5\' 4"'$  (1.626 m), weight 69.4 kg, last menstrual period 03/04/2021, SpO2 98 %. Body mass index is 26.26 kg/m.  Mental Status Per Nursing Assessment::   On Admission:  Suicidal ideation indicated by others  Demographic Factors:  Caucasian  Loss Factors: NA  Historical Factors: Prior suicide attempts and Family history of mental illness or substance abuse  Risk Reduction Factors:   Sense of responsibility to family, Religious beliefs about death, Living with another person, especially a relative, Positive social support, Positive therapeutic relationship, and Positive coping skills or problem solving skills  Continued Clinical Symptoms:  Depression:   Recent sense of peace/wellbeing Previous Psychiatric Diagnoses and Treatments  Cognitive Features That Contribute To Risk:  None    Suicide Risk:  Minimal: No identifiable suicidal ideation.  Patients presenting with no risk factors but with morbid ruminations; may be classified as minimal risk based on the severity of the depressive symptoms    Plan Of Care/Follow-up recommendations:  Activity:  as tolerated Diet:  regular diet  Salley Scarlet, MD 03/28/2021, 9:15 AM

## 2021-03-28 NOTE — Progress Notes (Signed)
Patient alert  and oriented x 3, affect is blunted thoughts are organized and coherent no distress noted, she denies SI/HI/AVH interacting appropriately with peers and staff, complaint with medication regimen, 15 minutes safety checks maintained will continue to monitor.

## 2021-03-28 NOTE — Progress Notes (Signed)
Patient pleasant and cooperative.Denies SI,HI and AVH. Patient verbalized understanding of discharge instructions,follow up care and prescriptions. Patient left unit with staff and transported by family.

## 2021-03-28 NOTE — Discharge Summary (Signed)
Physician Discharge Summary Note  Patient:  Leslie Elliott is an 45 y.o., female MRN:  [id number] DOB:  1975/09/07 Patient phone:  [phone] (home)  Patient address:   [address] [address],  Total Time spent with patient: 35 minutes- 25 minutes face-to-face contact with patient, 10 minutes documentation, coordination of care, scripts   Date of Admission:  03/26/2021 Date of Discharge: 03/28/2021  Reason for Admission:  Worsening psychosis and suicidal ideations  Principal Problem: MDD (major depressive disorder), recurrent, severe, with psychosis ([place]) Discharge Diagnoses: Principal Problem:   MDD (major depressive disorder), recurrent, severe, with psychosis ([place])   Past Psychiatric History: Past history of MDD with psychotic features. Has done well on Wellbutrin and Abilify for many years. Used to see Dr. Thurmond Butts who is now retiring. History of prior hospitalization and ECT treatment for depression.  Past Medical History:  Past Medical History:  Diagnosis Date   Anemia    Anxiety    Depression    Ovarian cyst     Past Surgical History:  Procedure Laterality Date   ECT TREATMENTS     ROBOTIC ASSISTED BILATERAL SALPINGO OOPHERECTOMY Left 10/30/2020   Procedure: XI ROBOTIC ASSISTED SALPINGO OOPHORECTOMY;  Surgeon: Benjaman Kindler, MD;  Location: [facility] ORS;  Service: Gynecology;  Laterality: Left;   ROBOTIC ASSISTED LAPAROSCOPIC OVARIAN CYSTECTOMY Bilateral 10/30/2020   Procedure: XI ROBOTIC ASSISTED LAPAROSCOPIC OVARIAN CYSTECTOMY;  Surgeon: Benjaman Kindler, MD;  Location: [facility] ORS;  Service: Gynecology;  Laterality: Bilateral;   TUBAL LIGATION  2002   XI ROBOTIC ASSISTED SALPINGECTOMY Right 10/30/2020   Procedure: XI ROBOTIC ASSISTED SALPINGECTOMY;  Surgeon: Benjaman Kindler, MD;  Location: [facility] ORS;  Service: Gynecology;  Laterality: Right;   Family History:  Family History  Problem Relation Age of Onset   Breast cancer Neg Hx    Family Psychiatric   History: daughter with depression and anxiety Social History:  Social History   Substance and Sexual Activity  Alcohol Use Yes   Comment: occassional     Social History   Substance and Sexual Activity  Drug Use Never    Social History   Socioeconomic History   Marital status: Married    Spouse name: Herbie [place]   Number of children: Not on file   Years of education: Not on file   Highest education level: Not on file  Occupational History   Not on file  Tobacco Use   Smoking status: Never   Smokeless tobacco: Never  Vaping Use   Vaping Use: Never used  Substance and Sexual Activity   Alcohol use: Yes    Comment: occassional   Drug use: Never   Sexual activity: Not on file  Other Topics Concern   Not on file  Social History Narrative   Not on file   Social Determinants of Health   Financial Resource Strain: Not on file  Food Insecurity: Not on file  Transportation Needs: Not on file  Physical Activity: Not on file  Stress: Not on file  Social Connections: Not on file    Hospital Course:  45 year old female presenting for worsening depression, paranoia, and suicidal ideations in the context of stopping medications. While here she was restarted on Wellbutrin XL 300 mg daily and Abilify was increased to 5 mg daily. Mood improved and hallucinations resolved. She denies suicidal ideations, homicidal ideations, visual hallucinations, and auditory hallucinations. Communication maintained with husband, including day of discharge, and he is comfortable with patient returning home. Both patient and husband agreeable to outpatient  psychiatric referral for follow-up.   Physical Findings: AIMS: Facial and Oral Movements Muscles of Facial Expression: None, normal Lips and Perioral Area: None, normal Jaw: None, normal Tongue: None, normal,Extremity Movements Upper (arms, wrists, hands, fingers): None, normal Lower (legs, knees, ankles, toes): None, normal, Trunk Movements Neck,  shoulders, hips: None, normal, Overall Severity Severity of abnormal movements (highest score from questions above): None, normal Incapacitation due to abnormal movements: None, normal Patient's awareness of abnormal movements (rate only patient's report): No Awareness, Dental Status Current problems with teeth and/or dentures?: No Does patient usually wear dentures?: No  CIWA:    COWS:     Musculoskeletal: Strength & Muscle Tone: within normal limits Gait & Station: normal Patient leans: N/A   Psychiatric Specialty Exam:  Presentation  General Appearance: Appropriate for Environment  Eye Contact:Good  Speech:Clear and Coherent; Normal Rate  Speech Volume:Normal  Handedness:Right   Mood and Affect  Mood:Euthymic  Affect:Congruent   Thought Process  Thought Processes:Coherent; Goal Directed  Descriptions of Associations:Intact  Orientation:Full (Time, Place and Person)  Thought Content:Logical  History of Schizophrenia/Schizoaffective disorder:No  Duration of Psychotic Symptoms:N/A  Hallucinations:Hallucinations: None  Ideas of Reference:None  Suicidal Thoughts:Suicidal Thoughts: No  Homicidal Thoughts:Homicidal Thoughts: No   Sensorium  Memory:Immediate Fair; Recent Fair; Remote Fair  Judgment:Intact  Insight:Present   Executive Functions  Concentration:Good  Attention Span:Good  [place] of Knowledge:Good  Language:Good   Psychomotor Activity  Psychomotor Activity:Psychomotor Activity: Normal   Assets  Assets:Communication Skills; Desire for Improvement; Financial Resources/Insurance; Housing; Intimacy; Leisure Time; Physical Health; Resilience; Social Support; Talents/Skills; Transportation; Vocational/Educational   Sleep  Sleep:Sleep: Good Number of Hours of Sleep: 7.5    Physical Exam: Physical Exam Vitals and nursing note reviewed.  Constitutional:      Appearance: Normal appearance.  HENT:     Head:  Normocephalic and atraumatic.     Right Ear: External ear normal.     Left Ear: External ear normal.     Nose: Nose normal.     Mouth/Throat:     Mouth: Mucous membranes are moist.     Pharynx: Oropharynx is clear.  Eyes:     Extraocular Movements: Extraocular movements intact.     Conjunctiva/sclera: Conjunctivae normal.     Pupils: Pupils are equal, round, and reactive to light.  Cardiovascular:     Rate and Rhythm: Normal rate.     Pulses: Normal pulses.  Pulmonary:     Effort: Pulmonary effort is normal.     Breath sounds: Normal breath sounds.  Abdominal:     General: Abdomen is flat.     Palpations: Abdomen is soft.  Musculoskeletal:        General: No swelling. Normal range of motion.     Cervical back: Normal range of motion and neck supple.  Skin:    General: Skin is warm and dry.  Neurological:     General: No focal deficit present.     Mental Status: She is alert and oriented to person, place, and time.  Psychiatric:        Mood and Affect: Mood normal.        Behavior: Behavior normal.        Thought Content: Thought content normal.        Judgment: Judgment normal.   Review of Systems  Constitutional: Negative.   HENT: Negative.    Eyes: Negative.   Respiratory: Negative.    Cardiovascular: Negative.   Gastrointestinal: Negative.   Genitourinary: Negative.  Musculoskeletal: Negative.   Skin: Negative.   Neurological: Negative.   Endo/Heme/Allergies: Negative.   Psychiatric/Behavioral:  Negative for depression, hallucinations, memory loss, substance abuse and suicidal ideas. The patient is not nervous/anxious and does not have insomnia.   Blood pressure 94/65, pulse 60, temperature 98 F (36.7 C), temperature source Oral, resp. rate 17, height '5\' 4"'$  (1.626 m), weight 69.4 kg, last menstrual period 03/04/2021, SpO2 98 %. Body mass index is 26.26 kg/m.   Social History   Tobacco Use  Smoking Status Never  Smokeless Tobacco Never   Tobacco  Cessation:  N/A, patient does not currently use tobacco products   Blood Alcohol level:  Lab Results  Component Value Date   ETH <10 [id number]    Metabolic Disorder Labs:  Lab Results  Component Value Date   HGBA1C 5.2 03/27/2021   MPG 103 03/27/2021   No results found for: PROLACTIN Lab Results  Component Value Date   CHOL 162 03/27/2021   TRIG 160 (H) 03/27/2021   HDL 34 (L) 03/27/2021   CHOLHDL 4.8 03/27/2021   VLDL 32 03/27/2021   [place] 96 03/27/2021    See Psychiatric Specialty Exam and Suicide Risk Assessment completed by Attending Physician prior to discharge.  Discharge destination:  Home  Is patient on multiple antipsychotic therapies at discharge:  No   Has Patient had three or more failed trials of antipsychotic monotherapy by history:  No  Recommended Plan for Multiple Antipsychotic Therapies: NA  Discharge Instructions     Diet general   Complete by: As directed    Increase activity slowly   Complete by: As directed       Allergies as of 03/28/2021   No Known Allergies      Medication List     STOP taking these medications    buPROPion 150 MG 12 hr tablet Commonly known as: WELLBUTRIN SR Replaced by: buPROPion 300 MG 24 hr tablet   ferrous sulfate 325 (65 FE) MG EC tablet   gabapentin 800 MG tablet Commonly known as: Neurontin   ibuprofen 200 MG tablet Commonly known as: ADVIL   OPTASE EX   propranolol 10 MG tablet Commonly known as: INDERAL       TAKE these medications      Indication  ARIPiprazole 5 MG tablet Commonly known as: ABILIFY Take 1 tablet (5 mg total) by mouth at bedtime. What changed: how much to take  Indication: Major Depressive Disorder   buPROPion 300 MG 24 hr tablet Commonly known as: WELLBUTRIN XL Take 1 tablet (300 mg total) by mouth daily. Start taking on: March 29, 2021 Replaces: buPROPion 150 MG 12 hr tablet  Indication: Major Depressive Disorder   docusate sodium 100 MG  capsule Commonly known as: COLACE Take 1 capsule (100 mg total) by mouth 2 (two) times daily. To keep stools soft  Indication: Constipation   vitamin B-12 1000 MCG tablet Commonly known as: CYANOCOBALAMIN Take 1,000 mcg by mouth daily.  Indication: Inadequate Vitamin B12         Follow-up recommendations:  Activity:  as tolerated Diet:  regular diet  Comments:  Printed 30-day scripts with 1 refill provided at discharge. Call 911 or proceed to nearest emergency department if suicidal thoughts return.   Signed: Salley Scarlet, MD 03/28/2021, 9:20 AM

## 2021-03-28 NOTE — Progress Notes (Signed)
Recreation Therapy Notes  INPATIENT RECREATION TR PLAN  Patient Details Name: Leslie Elliott MRN: [id number] DOB: 04-19-1976 Today's Date: 03/28/2021  Rec Therapy Plan Is patient appropriate for Therapeutic Recreation?: Yes Treatment times per week: at least 3 Estimated Length of Stay: 5-7 days TR Treatment/Interventions: Group participation (Comment)  Discharge Criteria Pt will be discharged from therapy if:: Discharged Treatment plan/goals/alternatives discussed and agreed upon by:: Patient/family  Discharge Summary Short term goals set: Patient will identify 3 positive coping skills strategies to use post d/c within 5 recreation therapy group sessions Short term goals met: Complete Progress toward goals comments: Groups attended Which groups?: Stress management, Social skills, Leisure education Reason goals not met: N/A Therapeutic equipment acquired: N/A Reason patient discharged from therapy: Discharge from hospital Pt/family agrees with progress & goals achieved: Yes Date patient discharged from therapy: 03/28/21   Leslie Elliott 03/28/2021, 11:36 AM

## 2021-10-05 ENCOUNTER — Inpatient Hospital Stay (HOSPITAL_COMMUNITY): Payer: BC Managed Care – PPO

## 2021-10-05 ENCOUNTER — Inpatient Hospital Stay (HOSPITAL_COMMUNITY)
Admission: EM | Admit: 2021-10-05 | Discharge: 2021-10-11 | DRG: 917 | Disposition: A | Discharge status: Other Acute Inpatient Hospital | Payer: BC Managed Care – PPO | Attending: Student | Admitting: Student

## 2021-10-05 ENCOUNTER — Other Ambulatory Visit: Payer: Self-pay

## 2021-10-05 ENCOUNTER — Encounter (HOSPITAL_COMMUNITY): Payer: Self-pay

## 2021-10-05 DIAGNOSIS — Z9851 Tubal ligation status: Secondary | ICD-10-CM | POA: Diagnosis not present

## 2021-10-05 DIAGNOSIS — Z888 Allergy status to other drugs, medicaments and biological substances status: Secondary | ICD-10-CM | POA: Diagnosis not present

## 2021-10-05 DIAGNOSIS — T50902A Poisoning by unspecified drugs, medicaments and biological substances, intentional self-harm, initial encounter: Secondary | ICD-10-CM

## 2021-10-05 DIAGNOSIS — T1491XA Suicide attempt, initial encounter: Secondary | ICD-10-CM | POA: Diagnosis present

## 2021-10-05 DIAGNOSIS — I959 Hypotension, unspecified: Secondary | ICD-10-CM | POA: Diagnosis present

## 2021-10-05 DIAGNOSIS — Z20822 Contact with and (suspected) exposure to covid-19: Secondary | ICD-10-CM | POA: Diagnosis present

## 2021-10-05 DIAGNOSIS — T43592A Poisoning by other antipsychotics and neuroleptics, intentional self-harm, initial encounter: Secondary | ICD-10-CM | POA: Diagnosis present

## 2021-10-05 DIAGNOSIS — G929 Unspecified toxic encephalopathy: Secondary | ICD-10-CM | POA: Diagnosis present

## 2021-10-05 DIAGNOSIS — J9690 Respiratory failure, unspecified, unspecified whether with hypoxia or hypercapnia: Secondary | ICD-10-CM

## 2021-10-05 DIAGNOSIS — D649 Anemia, unspecified: Secondary | ICD-10-CM | POA: Diagnosis present

## 2021-10-05 DIAGNOSIS — I952 Hypotension due to drugs: Secondary | ICD-10-CM | POA: Diagnosis not present

## 2021-10-05 DIAGNOSIS — T50901A Poisoning by unspecified drugs, medicaments and biological substances, accidental (unintentional), initial encounter: Principal | ICD-10-CM | POA: Diagnosis present

## 2021-10-05 DIAGNOSIS — Z90722 Acquired absence of ovaries, bilateral: Secondary | ICD-10-CM | POA: Diagnosis not present

## 2021-10-05 DIAGNOSIS — R519 Headache, unspecified: Secondary | ICD-10-CM | POA: Diagnosis not present

## 2021-10-05 DIAGNOSIS — R739 Hyperglycemia, unspecified: Secondary | ICD-10-CM | POA: Diagnosis not present

## 2021-10-05 DIAGNOSIS — I10 Essential (primary) hypertension: Secondary | ICD-10-CM | POA: Diagnosis not present

## 2021-10-05 DIAGNOSIS — D72829 Elevated white blood cell count, unspecified: Secondary | ICD-10-CM | POA: Diagnosis present

## 2021-10-05 DIAGNOSIS — E876 Hypokalemia: Secondary | ICD-10-CM | POA: Diagnosis present

## 2021-10-05 DIAGNOSIS — T424X2A Poisoning by benzodiazepines, intentional self-harm, initial encounter: Principal | ICD-10-CM | POA: Diagnosis present

## 2021-10-05 DIAGNOSIS — T447X5A Adverse effect of beta-adrenoreceptor antagonists, initial encounter: Secondary | ICD-10-CM | POA: Diagnosis present

## 2021-10-05 DIAGNOSIS — F333 Major depressive disorder, recurrent, severe with psychotic symptoms: Secondary | ICD-10-CM | POA: Diagnosis present

## 2021-10-05 DIAGNOSIS — Z4659 Encounter for fitting and adjustment of other gastrointestinal appliance and device: Secondary | ICD-10-CM

## 2021-10-05 DIAGNOSIS — R570 Cardiogenic shock: Secondary | ICD-10-CM | POA: Diagnosis present

## 2021-10-05 DIAGNOSIS — Z79899 Other long term (current) drug therapy: Secondary | ICD-10-CM

## 2021-10-05 DIAGNOSIS — F419 Anxiety disorder, unspecified: Secondary | ICD-10-CM | POA: Diagnosis present

## 2021-10-05 DIAGNOSIS — T447X2A Poisoning by beta-adrenoreceptor antagonists, intentional self-harm, initial encounter: Secondary | ICD-10-CM | POA: Diagnosis present

## 2021-10-05 DIAGNOSIS — R001 Bradycardia, unspecified: Secondary | ICD-10-CM | POA: Diagnosis present

## 2021-10-05 DIAGNOSIS — Y92009 Unspecified place in unspecified non-institutional (private) residence as the place of occurrence of the external cause: Secondary | ICD-10-CM

## 2021-10-05 DIAGNOSIS — E871 Hypo-osmolality and hyponatremia: Secondary | ICD-10-CM | POA: Diagnosis not present

## 2021-10-05 DIAGNOSIS — R579 Shock, unspecified: Secondary | ICD-10-CM | POA: Diagnosis not present

## 2021-10-05 DIAGNOSIS — R45851 Suicidal ideations: Secondary | ICD-10-CM | POA: Diagnosis present

## 2021-10-05 DIAGNOSIS — F3160 Bipolar disorder, current episode mixed, unspecified: Secondary | ICD-10-CM | POA: Diagnosis present

## 2021-10-05 DIAGNOSIS — G4700 Insomnia, unspecified: Secondary | ICD-10-CM | POA: Diagnosis present

## 2021-10-05 DIAGNOSIS — T43504A Poisoning by unspecified antipsychotics and neuroleptics, undetermined, initial encounter: Secondary | ICD-10-CM | POA: Diagnosis present

## 2021-10-05 DIAGNOSIS — Z9151 Personal history of suicidal behavior: Secondary | ICD-10-CM | POA: Diagnosis not present

## 2021-10-05 DIAGNOSIS — T447X2S Poisoning by beta-adrenoreceptor antagonists, intentional self-harm, sequela: Secondary | ICD-10-CM | POA: Diagnosis not present

## 2021-10-05 DIAGNOSIS — Z452 Encounter for adjustment and management of vascular access device: Secondary | ICD-10-CM

## 2021-10-05 DIAGNOSIS — F332 Major depressive disorder, recurrent severe without psychotic features: Secondary | ICD-10-CM | POA: Diagnosis not present

## 2021-10-05 LAB — RAPID URINE DRUG SCREEN, HOSP PERFORMED
Amphetamines: NOT DETECTED
Barbiturates: NOT DETECTED
Benzodiazepines: NOT DETECTED
Cocaine: NOT DETECTED
Opiates: NOT DETECTED
Tetrahydrocannabinol: NOT DETECTED

## 2021-10-05 LAB — GLUCOSE, CAPILLARY
Glucose-Capillary: 115 mg/dL — ABNORMAL HIGH (ref 70–99)
Glucose-Capillary: 129 mg/dL — ABNORMAL HIGH (ref 70–99)
Glucose-Capillary: 153 mg/dL — ABNORMAL HIGH (ref 70–99)
Glucose-Capillary: 168 mg/dL — ABNORMAL HIGH (ref 70–99)
Glucose-Capillary: 169 mg/dL — ABNORMAL HIGH (ref 70–99)
Glucose-Capillary: 176 mg/dL — ABNORMAL HIGH (ref 70–99)
Glucose-Capillary: 183 mg/dL — ABNORMAL HIGH (ref 70–99)
Glucose-Capillary: 194 mg/dL — ABNORMAL HIGH (ref 70–99)
Glucose-Capillary: 197 mg/dL — ABNORMAL HIGH (ref 70–99)
Glucose-Capillary: 215 mg/dL — ABNORMAL HIGH (ref 70–99)
Glucose-Capillary: 224 mg/dL — ABNORMAL HIGH (ref 70–99)
Glucose-Capillary: 244 mg/dL — ABNORMAL HIGH (ref 70–99)
Glucose-Capillary: 248 mg/dL — ABNORMAL HIGH (ref 70–99)
Glucose-Capillary: 248 mg/dL — ABNORMAL HIGH (ref 70–99)
Glucose-Capillary: 249 mg/dL — ABNORMAL HIGH (ref 70–99)
Glucose-Capillary: 258 mg/dL — ABNORMAL HIGH (ref 70–99)
Glucose-Capillary: 260 mg/dL — ABNORMAL HIGH (ref 70–99)
Glucose-Capillary: 265 mg/dL — ABNORMAL HIGH (ref 70–99)
Glucose-Capillary: 269 mg/dL — ABNORMAL HIGH (ref 70–99)
Glucose-Capillary: 269 mg/dL — ABNORMAL HIGH (ref 70–99)
Glucose-Capillary: 279 mg/dL — ABNORMAL HIGH (ref 70–99)

## 2021-10-05 LAB — PHOSPHORUS
Phosphorus: 1.6 mg/dL — ABNORMAL LOW (ref 2.5–4.6)
Phosphorus: 1 mg/dL — CL (ref 2.5–4.6)
Phosphorus: 1.7 mg/dL — ABNORMAL LOW (ref 2.5–4.6)

## 2021-10-05 LAB — CBC
HCT: 36 % (ref 36.0–46.0)
Hemoglobin: 11.9 g/dL — ABNORMAL LOW (ref 12.0–15.0)
MCH: 30.3 pg (ref 26.0–34.0)
MCHC: 33.1 g/dL (ref 30.0–36.0)
MCV: 91.6 fL (ref 80.0–100.0)
Platelets: 348 10*3/uL (ref 150–400)
RBC: 3.93 MIL/uL (ref 3.87–5.11)
RDW: 12.1 % (ref 11.5–15.5)
WBC: 24.1 10*3/uL — ABNORMAL HIGH (ref 4.0–10.5)
nRBC: 0 % (ref 0.0–0.2)

## 2021-10-05 LAB — ACETAMINOPHEN LEVEL: Acetaminophen (Tylenol), Serum: <10 ug/mL — ABNORMAL LOW (ref 10–30)

## 2021-10-05 LAB — CBC WITH DIFFERENTIAL/PLATELET
Abs Immature Granulocytes: 0.07 10*3/uL (ref 0.00–0.07)
Basophils Absolute: 0.1 10*3/uL (ref 0.0–0.1)
Basophils Relative: 1 %
Eosinophils Absolute: 0.2 10*3/uL (ref 0.0–0.5)
Eosinophils Relative: 1 %
HCT: 34.4 % — ABNORMAL LOW (ref 36.0–46.0)
Hemoglobin: 11.5 g/dL — ABNORMAL LOW (ref 12.0–15.0)
Immature Granulocytes: 1 %
Lymphocytes Relative: 15 %
Lymphs Abs: 2 10*3/uL (ref 0.7–4.0)
MCH: 30.8 pg (ref 26.0–34.0)
MCHC: 33.4 g/dL (ref 30.0–36.0)
MCV: 92.2 fL (ref 80.0–100.0)
Monocytes Absolute: 0.9 10*3/uL (ref 0.1–1.0)
Monocytes Relative: 7 %
Neutro Abs: 10.2 10*3/uL — ABNORMAL HIGH (ref 1.7–7.7)
Neutrophils Relative %: 75 %
Platelets: 430 10*3/uL — ABNORMAL HIGH (ref 150–400)
RBC: 3.73 MIL/uL — ABNORMAL LOW (ref 3.87–5.11)
RDW: 12.3 % (ref 11.5–15.5)
WBC: 13.4 10*3/uL — ABNORMAL HIGH (ref 4.0–10.5)
nRBC: 0 % (ref 0.0–0.2)

## 2021-10-05 LAB — COMPREHENSIVE METABOLIC PANEL
ALT: 10 U/L (ref 0–44)
ALT: 13 U/L (ref 0–44)
ALT: 14 U/L (ref 0–44)
AST: 14 U/L — ABNORMAL LOW (ref 15–41)
AST: 22 U/L (ref 15–41)
AST: 17 U/L (ref 15–41)
Albumin: 3.3 g/dL — ABNORMAL LOW (ref 3.5–5.0)
Albumin: 3 g/dL — ABNORMAL LOW (ref 3.5–5.0)
Albumin: 3 g/dL — ABNORMAL LOW (ref 3.5–5.0)
Alkaline Phosphatase: 40 U/L (ref 38–126)
Alkaline Phosphatase: 39 U/L (ref 38–126)
Alkaline Phosphatase: 43 U/L (ref 38–126)
Anion gap: 8 (ref 5–15)
Anion gap: 8 (ref 5–15)
Anion gap: 7 (ref 5–15)
BUN: 11 mg/dL (ref 6–20)
BUN: 11 mg/dL (ref 6–20)
BUN: <5 mg/dL — ABNORMAL LOW (ref 6–20)
CO2: 22 mmol/L (ref 22–32)
CO2: 18 mmol/L — ABNORMAL LOW (ref 22–32)
CO2: 22 mmol/L (ref 22–32)
Calcium: 7.8 mg/dL — ABNORMAL LOW (ref 8.9–10.3)
Calcium: 7.4 mg/dL — ABNORMAL LOW (ref 8.9–10.3)
Calcium: 6.9 mg/dL — ABNORMAL LOW (ref 8.9–10.3)
Chloride: 108 mmol/L (ref 98–111)
Chloride: 107 mmol/L (ref 98–111)
Chloride: 99 mmol/L (ref 98–111)
Creatinine, Ser: 0.96 mg/dL (ref 0.44–1.00)
Creatinine, Ser: 0.9 mg/dL (ref 0.44–1.00)
Creatinine, Ser: 0.66 mg/dL (ref 0.44–1.00)
GFR, Estimated: >60 mL/min (ref >60)
GFR, Estimated: >60 mL/min (ref >60)
GFR, Estimated: >60 mL/min (ref >60)
Glucose, Bld: 226 mg/dL — ABNORMAL HIGH (ref 70–99)
Glucose, Bld: 162 mg/dL — ABNORMAL HIGH (ref 70–99)
Glucose, Bld: 369 mg/dL — ABNORMAL HIGH (ref 70–99)
Potassium: 4 mmol/L (ref 3.5–5.1)
Potassium: 5.6 mmol/L — ABNORMAL HIGH (ref 3.5–5.1)
Potassium: 3.2 mmol/L — ABNORMAL LOW (ref 3.5–5.1)
Sodium: 138 mmol/L (ref 135–145)
Sodium: 133 mmol/L — ABNORMAL LOW (ref 135–145)
Sodium: 128 mmol/L — ABNORMAL LOW (ref 135–145)
Total Bilirubin: 0.2 mg/dL — ABNORMAL LOW (ref 0.3–1.2)
Total Bilirubin: 0.6 mg/dL (ref 0.3–1.2)
Total Bilirubin: 0.5 mg/dL (ref 0.3–1.2)
Total Protein: 5.6 g/dL — ABNORMAL LOW (ref 6.5–8.1)
Total Protein: 5.2 g/dL — ABNORMAL LOW (ref 6.5–8.1)
Total Protein: 5.3 g/dL — ABNORMAL LOW (ref 6.5–8.1)

## 2021-10-05 LAB — URINALYSIS, ROUTINE W REFLEX MICROSCOPIC
Bilirubin Urine: NEGATIVE
Glucose, UA: >=500 mg/dL — AB
Hgb urine dipstick: NEGATIVE
Ketones, ur: 5 mg/dL — AB
Leukocytes,Ua: NEGATIVE
Nitrite: NEGATIVE
Protein, ur: NEGATIVE mg/dL
Specific Gravity, Urine: 1.022 (ref 1.005–1.030)
pH: 5 (ref 5.0–8.0)

## 2021-10-05 LAB — MRSA NEXT GEN BY PCR, NASAL: MRSA by PCR Next Gen: NOT DETECTED

## 2021-10-05 LAB — CBG MONITORING, ED
Glucose-Capillary: 215 mg/dL — ABNORMAL HIGH (ref 70–99)
Glucose-Capillary: 272 mg/dL — ABNORMAL HIGH (ref 70–99)
Glucose-Capillary: 173 mg/dL — ABNORMAL HIGH (ref 70–99)
Glucose-Capillary: 246 mg/dL — ABNORMAL HIGH (ref 70–99)

## 2021-10-05 LAB — LACTIC ACID, PLASMA
Lactic Acid, Venous: 1.7 mmol/L (ref 0.5–1.9)
Lactic Acid, Venous: 3.6 mmol/L (ref 0.5–1.9)
Lactic Acid, Venous: 4.9 mmol/L (ref 0.5–1.9)
Lactic Acid, Venous: 2.7 mmol/L (ref 0.5–1.9)
Lactic Acid, Venous: 2.4 mmol/L (ref 0.5–1.9)

## 2021-10-05 LAB — MAGNESIUM
Magnesium: 1.6 mg/dL — ABNORMAL LOW (ref 1.7–2.4)
Magnesium: 1.4 mg/dL — ABNORMAL LOW (ref 1.7–2.4)
Magnesium: 3.4 mg/dL — ABNORMAL HIGH (ref 1.7–2.4)

## 2021-10-05 LAB — I-STAT VENOUS BLOOD GAS, ED
Acid-base deficit: 3 mmol/L — ABNORMAL HIGH (ref 0.0–2.0)
Bicarbonate: 22.4 mmol/L (ref 20.0–28.0)
Calcium, Ion: 1.06 mmol/L — ABNORMAL LOW (ref 1.15–1.40)
HCT: 32 % — ABNORMAL LOW (ref 36.0–46.0)
Hemoglobin: 10.9 g/dL — ABNORMAL LOW (ref 12.0–15.0)
O2 Saturation: 95 %
Potassium: 4 mmol/L (ref 3.5–5.1)
Sodium: 141 mmol/L (ref 135–145)
TCO2: 24 mmol/L (ref 22–32)
pCO2, Ven: 39.6 mmHg — ABNORMAL LOW (ref 44–60)
pH, Ven: 7.361 (ref 7.25–7.43)
pO2, Ven: 78 mmHg — ABNORMAL HIGH (ref 32–45)

## 2021-10-05 LAB — APTT: aPTT: 27 seconds (ref 24–36)

## 2021-10-05 LAB — ETHANOL: Alcohol, Ethyl (B): <10 mg/dL (ref <10)

## 2021-10-05 LAB — I-STAT ARTERIAL BLOOD GAS, ED
Acid-base deficit: 5 mmol/L — ABNORMAL HIGH (ref 0.0–2.0)
Bicarbonate: 20.4 mmol/L (ref 20.0–28.0)
Calcium, Ion: 1.16 mmol/L (ref 1.15–1.40)
HCT: 29 % — ABNORMAL LOW (ref 36.0–46.0)
Hemoglobin: 9.9 g/dL — ABNORMAL LOW (ref 12.0–15.0)
O2 Saturation: 99 %
Potassium: 3.4 mmol/L — ABNORMAL LOW (ref 3.5–5.1)
Sodium: 141 mmol/L (ref 135–145)
TCO2: 22 mmol/L (ref 22–32)
pCO2 arterial: 39.6 mmHg (ref 32–48)
pH, Arterial: 7.321 — ABNORMAL LOW (ref 7.35–7.45)
pO2, Arterial: 145 mmHg — ABNORMAL HIGH (ref 83–108)

## 2021-10-05 LAB — RESP PANEL BY RT-PCR (FLU A&B, COVID) ARPGX2
Influenza A by PCR: NEGATIVE
Influenza B by PCR: NEGATIVE
SARS Coronavirus 2 by RT PCR: NEGATIVE

## 2021-10-05 LAB — CORTISOL: Cortisol, Plasma: 48.4 ug/dL

## 2021-10-05 LAB — PROTIME-INR
INR: 1.2 (ref 0.8–1.2)
Prothrombin Time: 15.4 seconds — ABNORMAL HIGH (ref 11.4–15.2)

## 2021-10-05 LAB — SALICYLATE LEVEL: Salicylate Lvl: <7 mg/dL — ABNORMAL LOW (ref 7.0–30.0)

## 2021-10-05 LAB — HIV ANTIBODY (ROUTINE TESTING W REFLEX): HIV Screen 4th Generation wRfx: NONREACTIVE

## 2021-10-05 LAB — TSH: TSH: 0.577 u[IU]/mL (ref 0.350–4.500)

## 2021-10-05 LAB — POTASSIUM
Potassium: 3.5 mmol/L (ref 3.5–5.1)
Potassium: 3.5 mmol/L (ref 3.5–5.1)
Potassium: 3.4 mmol/L — ABNORMAL LOW (ref 3.5–5.1)

## 2021-10-05 MED ORDER — METOCLOPRAMIDE HCL 5 MG/ML IJ SOLN
5.0000 mg | Freq: Once | INTRAMUSCULAR | Status: AC
  Administered 2021-10-06: 5 mg via INTRAVENOUS
  Filled 2021-10-05: qty 2

## 2021-10-05 MED ORDER — INSULIN HIGH DOSE BOLUS VIA INFUSION (FOR BETA BLOCKER / CALCIUM CHANNEL BLOCKER OVERDOSE)
1.0000 [IU]/kg | Freq: Once | INTRAVENOUS | Status: AC
  Administered 2021-10-05: 71.7 [IU] via INTRAVENOUS
  Filled 2021-10-05: qty 72

## 2021-10-05 MED ORDER — DEXTROSE 50 % IV SOLN
1.0000 | INTRAVENOUS | Status: DC | PRN
  Administered 2021-10-05 – 2021-10-06 (×4): 50 mL via INTRAVENOUS
  Filled 2021-10-05 (×3): qty 50

## 2021-10-05 MED ORDER — DEXTROSE 10% IV SOLUTION (ML/KG/HR)
5.0000 mL/kg/h | INTRAVENOUS | Status: DC
  Administered 2021-10-05 – 2021-10-06 (×2): 5 mL/kg/h via INTRAVENOUS
  Filled 2021-10-05 (×7): qty 1000

## 2021-10-05 MED ORDER — DOPAMINE-DEXTROSE 3.2-5 MG/ML-% IV SOLN
0.0000 ug/kg/min | INTRAVENOUS | Status: DC
  Administered 2021-10-05: 5 ug/kg/min via INTRAVENOUS
  Filled 2021-10-05: qty 250

## 2021-10-05 MED ORDER — POTASSIUM CHLORIDE 10 MEQ/50ML IV SOLN
10.0000 meq | INTRAVENOUS | Status: AC
  Administered 2021-10-05 – 2021-10-06 (×2): 10 meq via INTRAVENOUS
  Filled 2021-10-05: qty 50

## 2021-10-05 MED ORDER — POLYETHYLENE GLYCOL 3350 17 G PO PACK: 17.0000 g | PACK | Freq: Every day | ORAL | Status: DC | PRN

## 2021-10-05 MED ORDER — EPINEPHRINE HCL 5 MG/250ML IV SOLN IN NS
0.5000 ug/min | INTRAVENOUS | Status: DC
  Administered 2021-10-05: 0.5 ug/min via INTRAVENOUS
  Administered 2021-10-05: 17 ug/min via INTRAVENOUS
  Filled 2021-10-05: qty 250

## 2021-10-05 MED ORDER — POTASSIUM PHOSPHATES 15 MMOLE/5ML IV SOLN: 30.0000 mmol | Freq: Once | INTRAVENOUS | Status: DC

## 2021-10-05 MED ORDER — GLUCAGON HCL RDNA (DIAGNOSTIC) 1 MG IJ SOLR
1.0000 mg/h | INTRAVENOUS | Status: DC
  Administered 2021-10-05: 1 mg/h via INTRAVENOUS
  Filled 2021-10-05: qty 5

## 2021-10-05 MED ORDER — NOREPINEPHRINE 4 MG/250ML-% IV SOLN
0.0000 ug/min | INTRAVENOUS | Status: DC
  Administered 2021-10-05: 2 ug/min via INTRAVENOUS

## 2021-10-05 MED ORDER — DIPHENHYDRAMINE HCL 50 MG/ML IJ SOLN
12.5000 mg | Freq: Once | INTRAMUSCULAR | Status: AC
  Administered 2021-10-05: 12.5 mg via INTRAVENOUS
  Filled 2021-10-05: qty 1

## 2021-10-05 MED ORDER — LACTATED RINGERS IV BOLUS
1000.0000 mL | Freq: Once | INTRAVENOUS | Status: AC
  Administered 2021-10-05: 1000 mL via INTRAVENOUS

## 2021-10-05 MED ORDER — DEXTROSE 10% IV SOLUTION (ML/KG/HR)
6.0000 mL/kg/h | INTRAVENOUS | Status: DC
  Administered 2021-10-05: 5 mL/kg/h via INTRAVENOUS
  Filled 2021-10-05 (×14): qty 1000

## 2021-10-05 MED ORDER — SODIUM CHLORIDE 0.9 % IV SOLN: INTRAVENOUS | Status: DC

## 2021-10-05 MED ORDER — DEXTROSE 50 % IV SOLN: 0.5000 g/kg | Freq: Once | INTRAVENOUS | Status: DC | PRN

## 2021-10-05 MED ORDER — ONDANSETRON HCL 4 MG/2ML IJ SOLN
INTRAMUSCULAR | Status: AC
  Filled 2021-10-05: qty 2

## 2021-10-05 MED ORDER — CALCIUM GLUCONATE-NACL 1-0.675 GM/50ML-% IV SOLN
1.0000 g | Freq: Once | INTRAVENOUS | Status: AC
  Administered 2021-10-05: 1000 mg via INTRAVENOUS
  Filled 2021-10-05: qty 50

## 2021-10-05 MED ORDER — MAGNESIUM SULFATE 4 GM/100ML IV SOLN
4.0000 g | Freq: Once | INTRAVENOUS | Status: AC
  Administered 2021-10-05: 4 g via INTRAVENOUS
  Filled 2021-10-05: qty 100

## 2021-10-05 MED ORDER — SODIUM CHLORIDE 0.9 % IV SOLN
6.0000 [IU]/kg/h | INTRAVENOUS | Status: DC
  Administered 2021-10-05 (×2): 7 [IU]/kg/h via INTRAVENOUS
  Administered 2021-10-05: 5 [IU]/kg/h via INTRAVENOUS
  Administered 2021-10-05: 6 [IU]/kg/h via INTRAVENOUS
  Administered 2021-10-05: 1 [IU]/kg/h via INTRAVENOUS
  Administered 2021-10-06 (×2): 7 [IU]/kg/h via INTRAVENOUS
  Filled 2021-10-05 (×12): qty 10

## 2021-10-05 MED ORDER — ONDANSETRON HCL 4 MG/2ML IJ SOLN
4.0000 mg | Freq: Once | INTRAMUSCULAR | Status: AC
Start: 2021-10-05 — End: 2021-10-05
  Administered 2021-10-05: 4 mg via INTRAVENOUS

## 2021-10-05 MED ORDER — POTASSIUM PHOSPHATES 15 MMOLE/5ML IV SOLN
30.0000 mmol | Freq: Once | INTRAVENOUS | Status: AC
  Administered 2021-10-05: 30 mmol via INTRAVENOUS
  Filled 2021-10-05: qty 10

## 2021-10-05 MED ORDER — PANTOPRAZOLE SODIUM 40 MG IV SOLR
40.0000 mg | Freq: Every day | INTRAVENOUS | Status: DC
  Administered 2021-10-05 – 2021-10-06 (×2): 40 mg via INTRAVENOUS
  Filled 2021-10-05 (×2): qty 10

## 2021-10-05 MED ORDER — POTASSIUM CHLORIDE 10 MEQ/50ML IV SOLN: 10.0000 meq | INTRAVENOUS | Status: DC

## 2021-10-05 MED ORDER — EPINEPHRINE HCL 5 MG/250ML IV SOLN IN NS
0.5000 ug/min | INTRAVENOUS | Status: DC
  Administered 2021-10-05: 0.5 ug/min via INTRAVENOUS

## 2021-10-05 MED ORDER — SODIUM PHOSPHATES 45 MMOLE/15ML IV SOLN
30.0000 mmol | Freq: Once | INTRAVENOUS | Status: DC
  Filled 2021-10-05: qty 10

## 2021-10-05 MED ORDER — POTASSIUM CHLORIDE 10 MEQ/100ML IV SOLN
10.0000 meq | INTRAVENOUS | Status: AC
  Administered 2021-10-05 (×4): 10 meq via INTRAVENOUS
  Filled 2021-10-05 (×4): qty 100

## 2021-10-05 MED ORDER — DOCUSATE SODIUM 100 MG PO CAPS: 100.0000 mg | ORAL_CAPSULE | Freq: Two times a day (BID) | ORAL | Status: DC | PRN

## 2021-10-05 MED ORDER — GLUCAGON HCL RDNA (DIAGNOSTIC) 1 MG IJ SOLR
2.0000 mg | Freq: Once | INTRAMUSCULAR | Status: AC
  Administered 2021-10-05: 2 mg via INTRAVENOUS

## 2021-10-05 NOTE — Progress Notes (Addendum)
Date and time results received: 10/05/21 1627 ? ? ?Test: lactic  ?Critical Value: 4.9 ? ?Name of Provider Notified: Dr. Erin Fulling  ? ?Orders Received? Or Actions Taken?: titrate epi down if appropriate  ?

## 2021-10-05 NOTE — Procedures (Signed)
Central Venous Catheter Insertion Procedure Note ? ?ANGELIAH WISDOM  ?[id number]  ?15-Apr-1976 ? ?Date:10/05/21  ?Time:6:16 PM  ? ?Provider Performing:Nathanal Hermiz  ? ?Procedure: Insertion of Non-tunneled Central Venous Catheter(36556) with US guidance (71062)  ? ?Indication(s) ?Medication administration ? ?Consent ?Risks of the procedure as well as the alternatives and risks of each were explained to the patient and/or caregiver.  Consent for the procedure was obtained and is signed in the bedside chart ? ?Anesthesia ?Topical only with 1% lidocaine  ? ?Timeout ?Verified patient identification, verified procedure, site/side was marked, verified correct patient position, special equipment/implants available, medications/allergies/relevant history reviewed, required imaging and test results available. ? ?Sterile Technique ?Maximal sterile technique including full sterile barrier drape, hand hygiene, sterile gown, sterile gloves, mask, hair covering, sterile ultrasound probe cover (if used). ? ?Procedure Description ?Area of catheter insertion was cleaned with chlorhexidine and draped in sterile fashion.  With real-time ultrasound guidance a central venous catheter was placed into the left subclavian vein. Nonpulsatile blood flow and easy flushing noted in all ports.  The catheter was sutured in place and sterile dressing applied. ? ?Complications/Tolerance ?None; patient tolerated the procedure well. ?Chest X-ray is ordered to verify placement for internal jugular or subclavian cannulation.   Chest x-ray is not ordered for femoral cannulation. ? ?EBL ?Minimal ? ?Specimen(s) ?None ? ?

## 2021-10-05 NOTE — ED Notes (Signed)
Minor PA at bedside ?

## 2021-10-05 NOTE — Progress Notes (Addendum)
eLink Physician-Brief Progress Note ?Patient Name: Leslie Elliott ?DOB: 10-Jan-1976 ?MRN: [id number] ? ? ?Date of Service ? 10/05/2021  ?HPI/Events of Note ? Notified of hypertension and hyperglycemia.  ? ?Pt with multi-drug overdose and was on epinephrine gtt for bradycardia and hypotension.  Pt on D10 and insulin gtt with glucoses in 248-- 260.  ? ?Pt now hypertensive with BP 189/93, HR 43, RR 17, O2 sats 96%.  ?eICU Interventions ? Increase insulin gtt to 7 units/kg.  ?Start on dopamine and titrate epinephrine to off.   ? ? ? ?Intervention Category ?Major Interventions: Hypertension - evaluation and management;Other: ? ?Elsie Lincoln ?10/05/2021, 8:24 PM ? ?9:42 PM ?Pt with nausea.   ?K also at 3.4. ? ?Plan> ?Attach NGT to low intermittent wall suction.  ?Agree with pharmacy recommendations to replete with IV KCL 61mq x 4. ? ?10:17 PM ?Spoke with MD at poison control and per our discussion, he was agreeable with decreasing the dextrose rate as we are giving high doses of insulin with this and he was concerned about causing K shifts.   ? ?MAP 69, HR 40s-50s on dopamine at 5, epinephrine at 921m/min (down from 1479mmin). ? ?Plan> ?Decrease glucose D10 to 5ml75m/hr and monitor glucose before adjusting insulin gtt.  ?Continue titrating epinephrine gtt down with goal to discontinue.   ?Decrease Kcl to 10me51m2 doses only.  ? ?1:51 AM ?Pt is off epinephrine now, on dopamine 10mcg6mmin, HR 41, RR 19, O2 sats 96%.  ?Glucose 226. K 2.9 after 20 meq IV Kcl.  ? ?Plan> ?Replete K.  Continue dopamine, insulin and glucose.  ? ?3:51 AM ?Glucose now at 175.  ? ?Plan> ?Decrease insulin to 6 units/kg/hr. ? ?5:13 AM ?ABG on RA 7.343/32.6/68. ? ?Plan> ?Start on 2L nasal cannula.  ?Get CXR.  ? ?5:53 AM ?K 2.9. ?Current weight is also much higher than initial weight from 71.7 --> 85.9kg.  ? ?Plan> ?Replete K.  ?Adjust gtts accordingly.   ? ?6:57 AM ?Na at 119, K 2.7, phos 1.6, calcium corrected for low albumin is 7.6.  Because of the  change in the weight, amount of insulin and D10 has also increased.  ? ?Pt remains on dopamine at 10mcg/20min.  ? ?Plan> ?Decrease D10 to 3ml/kg/60m(85.9kg) --> 257.7ml/hr a9mdecrease insulin gtt to 4 units/kg/hr (85.9kg) --> 343.6ml/hr.  74mplete Kphos 30mmol.  ?55mete calcium with 2g calcium gluconate. ?

## 2021-10-05 NOTE — ED Provider Notes (Signed)
[facility] EMERGENCY DEPARTMENT Provider Note  History   Chief Complaint  Patient presents with   Drug Overdose   Suicide Attempt   Leslie Elliott is a 46 y.o. female w/ h/o MDD w/ psychotic features who p/w interventional OD (abilify, venlafaxine, propranolol).   The history is provided by the EMS personnel and the patient.  Illness Location:  Bradycardia Quality:  HR 30's Severity:  Unable to specify Onset quality:  Unable to specify Duration: Ingestion of propranolol at 0815. Timing:  Constant Progression:  Unchanged Chronicity:  New Context:  See MDM  Past Medical History:  Diagnosis Date   Anemia    Anxiety    Depression    Ovarian cyst     Social History   Tobacco Use   Smoking status: Never   Smokeless tobacco: Never  Vaping Use   Vaping Use: Never used  Substance Use Topics   Alcohol use: Yes    Comment: occassional   Drug use: Never     Family History  Problem Relation Age of Onset   Breast cancer Neg Hx     Review of Systems  Unable to perform ROS: Acuity of condition    Physical Exam   Today's Vitals   10/05/21 1630 10/05/21 1700 10/05/21 1800 10/05/21 1830  BP: (!) 142/66 (!) 153/70 (!) 168/71 (!) 156/76  Pulse: (!) 36 (!) 38 (!) 43 (!) 44  Resp: 18 18 (!) 21 19  Temp:      TempSrc:      SpO2: 97% 96% 98% 98%  Weight:      Height:      PainSc:         Physical Exam Vitals reviewed.  Constitutional:      Appearance: She is normal weight. She is ill-appearing, toxic-appearing and diaphoretic.  HENT:     Head: Normocephalic and atraumatic.     Nose: Nose normal. No congestion.     Mouth/Throat:     Mouth: Mucous membranes are moist.     Pharynx: Oropharynx is clear. No oropharyngeal exudate.  Eyes:     Extraocular Movements: Extraocular movements intact.     Pupils: Pupils are equal, round, and reactive to light.  Cardiovascular:     Rate and Rhythm: Regular rhythm. Bradycardia present.     Pulses:  Normal pulses.     Heart sounds: Normal heart sounds. No murmur heard. Pulmonary:     Effort: Pulmonary effort is normal. No respiratory distress.     Breath sounds: Normal breath sounds. No stridor. No wheezing, rhonchi or rales.  Abdominal:     Palpations: Abdomen is soft.     Tenderness: There is no abdominal tenderness. There is no right CVA tenderness, left CVA tenderness, guarding or rebound.  Musculoskeletal:        General: Normal range of motion.     Cervical back: Normal range of motion and neck supple. No rigidity.     Right lower leg: No edema.     Left lower leg: No edema.  Skin:    General: Skin is warm.     Capillary Refill: Capillary refill takes less than 2 seconds.     Findings: No rash.  Neurological:     Comments: GCS 14, moving all extremities, following commands  Psychiatric:     Comments: Unable to assess    ED Course  Procedures  Medical Decision Making:  Leslie Elliott is a 46 y.o. female w/ h/o MDD w/ psychotic  features who p/w interventional OD (abilify, venlafaxine, propranolol).   Reportedly was having a mental health crisis, was in the car while her husband was driving her to the crisis center for evaluation when the patient admitted to intentional overdose at 0815 this morning of an unknown amount of her abilify, venlafaxine, and propranolol.  Patient states she "took the whole bottle" for each medicine, she does not know the dose or number of tablets in each bottle, but family thinks that these were relatively full/recently refilled bottles.  Denies any additional ingestion or recreational substance use.  Husband immediately called EMS.  EMS found the patient with BG 146, BP 60/40, HR.  EMS administered 2 mg glucagon and 700 cc NaCl bolus and initiated and epi drip at 24mg.  EMS reports BP improved 92/70, HR 52 transiently.  Patient opens her eyes to voice, is alert/oriented, and follows simple motor commands making her GCS 14.  Without prompting, she  takes shallow breaths, but with prompting she is able to take deep breaths.  She is not hypoxic.  On arrival to the ED, BP 110/55, HR 31, 97 percent on RA.  Patient was initial primary historian who told the above story.  Story was verified by EMS and later husband at bedside.  In the ED, initiated on nor epi drip, given glucagon bolus and started on glucagon drip, initiated on high-dose insulin infusion, and given calcium gluconate.  EKG with sinus bradycardia (HR 49), QRS 99, QTc 373.  Spoke with MRonalee Belts RN at poison control at 11 AM who agreed with the aforementioned work-up.  Noted with venlafaxine and Abilify have risk for QTc prolongation and with propranolol have a risk of bradycardia, given the combined potential cardiac changes increases the patient's risk for torsades.  Recommend replacing potassium, magnesium, and even calcium as needed to avoid this in addition to the aforementioned work-up.  Additionally recommends obtaining Tylenol level (which has been ordered) as a potential coingestions though the patient denies this.  MRonalee Beltsalso recommends keeping an eye on the patient's urine output as she is admitted.   ER provider interpretation of Imaging / Radiology:  Not indicated  ER provider interpretation of EKG:  Sinus brady, qrs 99, qtc 373  ER provider interpretation of Labs:  FSBS 215 CBC WBC 13.4, HGB 11.5 CMP no electrolyte abnormality, no AKI TSH WNR Lactic acid pending Acetaminophen level WNR Salicylate level WNR Ethanol level WNR Covid/flu negative UA pending UDS pending  Key medications administered in the ER:  Medications  insulin HIGH DOSE (4 units/mL) infusion for beta blocker/calcium channel blocker overdose (6 Units/kg/hr  71.7 kg Intravenous New Bag/Given 10/05/21 1848)  dextrose 50 % solution 35.85 g (has no administration in time range)  dextrose 10 % infusion (6 mL/kg/hr  71.7 kg Intravenous Rate/Dose Change 10/05/21 1333)  0.9 %  sodium chloride infusion (  Intravenous New Bag/Given 10/05/21 1256)  docusate sodium (COLACE) capsule 100 mg (has no administration in time range)  polyethylene glycol (MIRALAX / GLYCOLAX) packet 17 g (has no administration in time range)  pantoprazole (PROTONIX) injection 40 mg (has no administration in time range)  EPINEPHrine (ADRENALIN) 5 mg in NS 250 mL (0.02 mg/mL) premix infusion (17 mcg/min Intravenous New Bag/Given 10/05/21 1645)  dextrose 50 % solution 50 mL (50 mLs Intravenous Given 10/05/21 1437)  metoCLOPramide (REGLAN) injection 5 mg (has no administration in time range)  potassium PHOSPHATE 30 mmol in dextrose 5 % 500 mL infusion (30 mmol Intravenous New Bag/Given 10/05/21 1648)  glucagon (human recombinant) (GLUCAGEN) injection 2 mg (2 mg Intravenous Given 10/05/21 1014)  insulin HIGH DOSE bolus via infusion (for beta blocker / calcium channel blocker overdose) (71.7 Units Intravenous Bolus from Bag 10/05/21 1050)  ondansetron (ZOFRAN) injection 4 mg ( Intravenous See Procedure Record 10/05/21 1028)  diphenhydrAMINE (BENADRYL) injection 12.5 mg (12.5 mg Intravenous Given 10/05/21 1027)  calcium gluconate 1 g/ 50 mL sodium chloride IVPB (0 mg Intravenous Stopped 10/05/21 1159)  magnesium sulfate IVPB 4 g 100 mL (4 g Intravenous New Bag/Given 10/05/21 1311)  potassium chloride 10 mEq in 100 mL IVPB (10 mEq Intravenous New Bag/Given 10/05/21 1609)  lactated ringers bolus 1,000 mL (1,000 mLs Intravenous New Bag/Given 10/05/21 1706)   Diagnoses considered: Most likely propranolol OD. Ddx includes alcohol, electrolyte abnormalities (Na/Ca), endocrine (myxedema coma, thyroid storm), encephalopathy (hepatic, HTN, heat, wernicke), infection (encephalitis, sepsis), toxidromes of overdose or withdrawal, hypoxia, uremia, trauma (hemorrhagic shock, TBI), hypothermia, hypoglycemia, psychogenic, seizure, stroke, or intracranial bleed. Unlikely dementia as acute in nature and not progressively degenerative.   Consulted: None  Admit  to ICU  Patient seen in conjunction with Dr. Dawna Part medical dictation software was used in the creation of this note.   Electronically signed by: Wynetta Fines, MD on 10/05/2021 at 7:06 PM  Clinical Impression:  1. Respiratory failure ([place])   2. Encounter for nasogastric (NG) tube placement   3. Encounter for central line placement     Dispo: Imagene Riches, MD 10/05/21 Docia Chuck    Carmin Muskrat, MD 10/07/21 1452

## 2021-10-05 NOTE — Progress Notes (Signed)
Date and time results received: 10/05/21 1658 ? ? ?Test: phosphorus  ?Critical Value: 1.0 ? ?Name of Provider Notified: Dr. Erin Fulling  ? ?Orders Received? Or Actions Taken?: no new orders ?

## 2021-10-05 NOTE — ED Notes (Signed)
Pt's husband at bedside.

## 2021-10-05 NOTE — H&P (Signed)
? ?NAME:  Leslie Elliott, MRN:  [id number], DOB:  23-Apr-1976, LOS: 0 ?ADMISSION DATE:  10/05/2021, CONSULTATION DATE: 10/06/2018 ?REFERRING MD: Emergency department physician, CHIEF COMPLAINT: Medication overdose ? ?History of Present Illness:  ?46 year old female who suffers from psychosis, suicidal ideation is under psychiatric care and done well from ECT therapy done in 2007 but recently developed more suicidal ideation and became less interactive with environment stopped eating and today 10/05/2021 she had had medications put away took them without a one-to-one assistant of Inderal, Abilify, Ativan.  She is requiring Levophed to keep her heart rate greater than 30 she has been fluid resuscitated pulmonary critical care called to bedside to admit ? ?Pertinent  Medical History  ? ?Past Medical History:  ?Diagnosis Date  ? Anemia   ? Anxiety   ? Depression   ? Ovarian cyst   ? ? ? ?Significant Hospital Events: ?Including procedures, antibiotic start and stop dates in addition to other pertinent events   ?Started on Levophed glucagon dextrose and insulin ? ?Interim History / Subjective:  ?Admitted with overdose beta-blocker antipsychotic benzodiazepines ? ?Objective   ?Blood pressure (!) 102/42, pulse (!) 33, temperature (!) 97 ?F (36.1 ?C), temperature source Temporal, resp. rate 15, height '5\' 4"'$  (1.626 m), weight 71.7 kg, SpO2 100 %. ?   ?   ? ?Intake/Output Summary (Last 24 hours) at 10/05/2021 1131 ?Last data filed at 10/05/2021 1120 ?Gross per 24 hour  ?Intake 727.24 ml  ?Output --  ?Net 727.24 ml  ? ?Filed Weights  ? 10/05/21 1017  ?Weight: 71.7 kg  ? ? ?Examination: ?General: Middle-age female who arouses and follows commands ?HENT: No JVD or lymphadenopathy is ?Lungs: Coarse rhonchi bilateral ?Cardiovascular: Heart sounds are regular sinus bradycardia with a rate of 30 ?Abdomen: Soft nontender positive bowel ?Extremities: Warm and dry without edema ?Neuro: Arouses to voice follows commands and can speak but is  sedated ?GU: Voids ? ?Resolved Hospital Problem list   ? ? ?Assessment & Plan:  ?Shock and bradycardia secondary to overdose with beta-blockers antipsychotics. ?Admit to the intensive care ?Levophed to keep heart rate greater than 30 ?Weanable glucagon and high-dose dextrose and insulin ?Weaning glucagon to off ?Fluid resuscitation ?Monitoring QRS with cardiac monitor ?Maintain systolic blood pressure greater than 90 ? ?Psychosis suicidal ideation with major depressive disorder history of electroconvulsive therapy recent hospitalization in psychiatric hospital ?Psych consult once stabilized ? ?Best Practice (right click and "Reselect all SmartList Selections" daily)  ? ?Diet/type: NPO ?DVT prophylaxis: not indicated ?GI prophylaxis: PPI ?Lines: N/A ?Foley:  N/A ?Code Status:  full code ?Last date of multidisciplinary goals of care discussion [tbd] ?10/05/2021 husband updated at bedside emergency room trauma A ? ?Labs   ?CBC: ?Recent Labs  ?Lab 10/05/21 ?1026 10/05/21 ?1037  ?WBC 13.4*  --   ?NEUTROABS 10.2*  --   ?HGB 11.5* 10.9*  ?HCT 34.4* 32.0*  ?MCV 92.2  --   ?PLT 430*  --   ? ? ?Basic Metabolic Panel: ?Recent Labs  ?Lab 10/05/21 ?1026 10/05/21 ?1037  ?NA 138 141  ?K 4.0 4.0  ?CL 108  --   ?CO2 22  --   ?GLUCOSE 226*  --   ?BUN 11  --   ?CREATININE 0.96  --   ?CALCIUM 7.8*  --   ? ?GFR: ?Estimated Creatinine Clearance: 70.3 mL/min (by C-G formula based on SCr of 0.96 mg/dL). ?Recent Labs  ?Lab 10/05/21 ?1026 10/05/21 ?1057  ?WBC 13.4*  --   ?LATICACIDVEN  --  1.7  ? ? ?  Liver Function Tests: ?Recent Labs  ?Lab 10/05/21 ?1026  ?AST 14*  ?ALT 10  ?ALKPHOS 40  ?BILITOT 0.2*  ?PROT 5.6*  ?ALBUMIN 3.3*  ? ?No results for input(s): LIPASE, AMYLASE in the last 168 hours. ?No results for input(s): AMMONIA in the last 168 hours. ? ?ABG ?   ?Component Value Date/Time  ? HCO3 22.4 10/05/2021 1037  ? TCO2 24 10/05/2021 1037  ? ACIDBASEDEF 3.0 (H) 10/05/2021 1037  ? O2SAT 95 10/05/2021 1037  ?  ? ?Coagulation Profile: ?No  results for input(s): INR, PROTIME in the last 168 hours. ? ?Cardiac Enzymes: ?No results for input(s): CKTOTAL, CKMB, CKMBINDEX, TROPONINI in the last 168 hours. ? ?HbA1C: ?Hgb A1c MFr Bld  ?Date/Time Value Ref Range Status  ?03/27/2021 06:26 AM 5.2 4.8 - 5.6 % Final  ?  Comment:  ?  (NOTE) ?        Prediabetes: 5.7 - 6.4 ?        Diabetes: >6.4 ?        Glycemic control for adults with diabetes: <7.0 ?  ? ? ?CBG: ?Recent Labs  ?Lab 10/05/21 ?0955 10/05/21 ?1114  ?GLUCAP 215* 272*  ? ? ?Review of Systems:   ?na ? ?Past Medical History:  ?She,  has a past medical history of Anemia, Anxiety, Depression, and Ovarian cyst.  ? ?Surgical History:  ? ?Past Surgical History:  ?Procedure Laterality Date  ? ECT TREATMENTS    ? ROBOTIC ASSISTED BILATERAL SALPINGO OOPHERECTOMY Left 10/30/2020  ? Procedure: XI ROBOTIC ASSISTED SALPINGO OOPHORECTOMY;  Surgeon: Benjaman Kindler, MD;  Location: [facility] ORS;  Service: Gynecology;  Laterality: Left;  ? ROBOTIC ASSISTED LAPAROSCOPIC OVARIAN CYSTECTOMY Bilateral 10/30/2020  ? Procedure: XI ROBOTIC ASSISTED LAPAROSCOPIC OVARIAN CYSTECTOMY;  Surgeon: Benjaman Kindler, MD;  Location: [facility] ORS;  Service: Gynecology;  Laterality: Bilateral;  ? TUBAL LIGATION  2002  ? XI ROBOTIC ASSISTED SALPINGECTOMY Right 10/30/2020  ? Procedure: XI ROBOTIC ASSISTED SALPINGECTOMY;  Surgeon: Benjaman Kindler, MD;  Location: [facility] ORS;  Service: Gynecology;  Laterality: Right;  ?  ? ?Social History:  ? reports that she has never smoked. She has never used smokeless tobacco. She reports current alcohol use. She reports that she does not use drugs.  ? ?Family History:  ?Her family history is negative for Breast cancer.  ? ?Allergies ?Allergies  ?Allergen Reactions  ? Zofran [Ondansetron Hcl] Rash  ?  ? ?Home Medications  ?Prior to Admission medications   ?Medication Sig Start Date End Date Taking? Authorizing Provider  ?ARIPiprazole (ABILIFY) 5 MG tablet Take 1 tablet (5 mg total) by mouth at bedtime. 03/28/21    Salley Scarlet, MD  ?buPROPion (WELLBUTRIN XL) 300 MG 24 hr tablet Take 1 tablet (300 mg total) by mouth daily. 03/29/21   Salley Scarlet, MD  ?docusate sodium (COLACE) 100 MG capsule Take 1 capsule (100 mg total) by mouth 2 (two) times daily. To keep stools soft ?Patient not taking: Reported on 03/25/2021 10/30/20   Benjaman Kindler, MD  ?vitamin B-12 (CYANOCOBALAMIN) 1000 MCG tablet Take 1,000 mcg by mouth daily. ?Patient not taking: Reported on 03/25/2021    [provider]  ?  ? ?Critical care time: 40 ?  ? ? ?Richardson Landry Maciah Feeback ACNP ?Acute Care Nurse Practitioner ?[place] ?Please consult Amion ?10/05/2021, 11:31 AM ? ? ? ? ?

## 2021-10-05 NOTE — Progress Notes (Signed)
Date and time results received: 10/05/21 1326 ?(use smartphrase ".now" to insert current time) ? ?Test: lactic  ?Critical Value: 3.6 ? ?Name of Provider Notified: Dr Erin Fulling  ? ?Orders Received? Or Actions Taken?: no new orders at this time  ?

## 2021-10-05 NOTE — ED Notes (Signed)
Pads placed on pt 

## 2021-10-05 NOTE — Progress Notes (Signed)
?   10/05/21 1415  ?Clinical Encounter Type  ?Visited With Health care provider;Patient not available  ?Visit Type Spiritual support;Behavioral Health  ?Referral From Patient ?(Patient requests Prayer)  ?Consult/Referral To Chaplain  ? ?Attempted visit with Leslie Elliott. Spoke with patient's primary nurse, Leslie Elliott, who stated that patient's husband is present and patient is currently sleeping. Nurse suggested visit later in the day. [address] [place], [place] [phone]. ?

## 2021-10-05 NOTE — Progress Notes (Signed)
Pharmacy Electrolyte Replacement ? ?Recent Labs: ? ?Recent Labs  ?  10/05/21 ?1026 10/05/21 ?1037  ?K 4.0 4.0  ?MG 1.6*  --   ?CREATININE 0.96  --   ? ? ?Low Critical Values (K </= 2.5, Phos </= 1, Mg </= 1) Present: ?None ? ?MD ContactedErin Fulling, J ? ?Plan: Mag sulfate 4g IV x 1 per protocol ?Give KCl 26mq IV x 4 doses per CCM request (discussed with Dr. DErin Fulling ?Rechecking potassium q2h per protocol while on high-dose insulin ? ? ?HArturo Morton PharmD, BCPS ?Please check AMION for all [place] ?Clinical Pharmacist ?10/05/2021 12:24 PM ?

## 2021-10-05 NOTE — ED Triage Notes (Signed)
[place] [facility] [place] pt's husband called EMS d/t pt admitting to him that she took, "all of my meds". Per husband, they were on the way to a crisis center. Pt's daughter reported the 3 bottles of propanolol, abilify, effexor were full and now they are empty. EMS reports initial GCS 15 alert and oriented and GCS dropped to 12. Pt initial BP was 60/40 and initial HR 30. EMS gave '2mg'$  glucagon and a total of 785m NS bolus. EMS also started an epi gtt initially at 221m and increased it to 49m46m Pt HR and BP improved to 92/70 and HR 52. Pt last VS 78/55 and HR 30, RR 16, Capnography 35, CBG 215. Pt was on 2L O2 via [place] for comfort. ?

## 2021-10-06 ENCOUNTER — Inpatient Hospital Stay (HOSPITAL_COMMUNITY): Payer: BC Managed Care – PPO

## 2021-10-06 DIAGNOSIS — R570 Cardiogenic shock: Secondary | ICD-10-CM | POA: Diagnosis not present

## 2021-10-06 DIAGNOSIS — T50902A Poisoning by unspecified drugs, medicaments and biological substances, intentional self-harm, initial encounter: Secondary | ICD-10-CM | POA: Diagnosis not present

## 2021-10-06 LAB — GLUCOSE, CAPILLARY
Glucose-Capillary: 105 mg/dL — ABNORMAL HIGH (ref 70–99)
Glucose-Capillary: 112 mg/dL — ABNORMAL HIGH (ref 70–99)
Glucose-Capillary: 113 mg/dL — ABNORMAL HIGH (ref 70–99)
Glucose-Capillary: 115 mg/dL — ABNORMAL HIGH (ref 70–99)
Glucose-Capillary: 116 mg/dL — ABNORMAL HIGH (ref 70–99)
Glucose-Capillary: 116 mg/dL — ABNORMAL HIGH (ref 70–99)
Glucose-Capillary: 116 mg/dL — ABNORMAL HIGH (ref 70–99)
Glucose-Capillary: 117 mg/dL — ABNORMAL HIGH (ref 70–99)
Glucose-Capillary: 119 mg/dL — ABNORMAL HIGH (ref 70–99)
Glucose-Capillary: 120 mg/dL — ABNORMAL HIGH (ref 70–99)
Glucose-Capillary: 120 mg/dL — ABNORMAL HIGH (ref 70–99)
Glucose-Capillary: 123 mg/dL — ABNORMAL HIGH (ref 70–99)
Glucose-Capillary: 123 mg/dL — ABNORMAL HIGH (ref 70–99)
Glucose-Capillary: 124 mg/dL — ABNORMAL HIGH (ref 70–99)
Glucose-Capillary: 126 mg/dL — ABNORMAL HIGH (ref 70–99)
Glucose-Capillary: 128 mg/dL — ABNORMAL HIGH (ref 70–99)
Glucose-Capillary: 134 mg/dL — ABNORMAL HIGH (ref 70–99)
Glucose-Capillary: 136 mg/dL — ABNORMAL HIGH (ref 70–99)
Glucose-Capillary: 137 mg/dL — ABNORMAL HIGH (ref 70–99)
Glucose-Capillary: 142 mg/dL — ABNORMAL HIGH (ref 70–99)
Glucose-Capillary: 142 mg/dL — ABNORMAL HIGH (ref 70–99)
Glucose-Capillary: 147 mg/dL — ABNORMAL HIGH (ref 70–99)
Glucose-Capillary: 150 mg/dL — ABNORMAL HIGH (ref 70–99)
Glucose-Capillary: 156 mg/dL — ABNORMAL HIGH (ref 70–99)
Glucose-Capillary: 159 mg/dL — ABNORMAL HIGH (ref 70–99)
Glucose-Capillary: 162 mg/dL — ABNORMAL HIGH (ref 70–99)
Glucose-Capillary: 162 mg/dL — ABNORMAL HIGH (ref 70–99)
Glucose-Capillary: 168 mg/dL — ABNORMAL HIGH (ref 70–99)
Glucose-Capillary: 169 mg/dL — ABNORMAL HIGH (ref 70–99)
Glucose-Capillary: 174 mg/dL — ABNORMAL HIGH (ref 70–99)
Glucose-Capillary: 175 mg/dL — ABNORMAL HIGH (ref 70–99)
Glucose-Capillary: 179 mg/dL — ABNORMAL HIGH (ref 70–99)
Glucose-Capillary: 185 mg/dL — ABNORMAL HIGH (ref 70–99)
Glucose-Capillary: 188 mg/dL — ABNORMAL HIGH (ref 70–99)
Glucose-Capillary: 190 mg/dL — ABNORMAL HIGH (ref 70–99)
Glucose-Capillary: 191 mg/dL — ABNORMAL HIGH (ref 70–99)
Glucose-Capillary: 196 mg/dL — ABNORMAL HIGH (ref 70–99)
Glucose-Capillary: 197 mg/dL — ABNORMAL HIGH (ref 70–99)
Glucose-Capillary: 198 mg/dL — ABNORMAL HIGH (ref 70–99)
Glucose-Capillary: 199 mg/dL — ABNORMAL HIGH (ref 70–99)
Glucose-Capillary: 204 mg/dL — ABNORMAL HIGH (ref 70–99)
Glucose-Capillary: 207 mg/dL — ABNORMAL HIGH (ref 70–99)
Glucose-Capillary: 214 mg/dL — ABNORMAL HIGH (ref 70–99)
Glucose-Capillary: 215 mg/dL — ABNORMAL HIGH (ref 70–99)
Glucose-Capillary: 218 mg/dL — ABNORMAL HIGH (ref 70–99)
Glucose-Capillary: 226 mg/dL — ABNORMAL HIGH (ref 70–99)
Glucose-Capillary: 230 mg/dL — ABNORMAL HIGH (ref 70–99)
Glucose-Capillary: 265 mg/dL — ABNORMAL HIGH (ref 70–99)
Glucose-Capillary: 318 mg/dL — ABNORMAL HIGH (ref 70–99)

## 2021-10-06 LAB — POTASSIUM
Potassium: 2.6 mmol/L — CL (ref 3.5–5.1)
Potassium: 2.6 mmol/L — CL (ref 3.5–5.1)
Potassium: 2.7 mmol/L — CL (ref 3.5–5.1)
Potassium: 2.8 mmol/L — ABNORMAL LOW (ref 3.5–5.1)
Potassium: 2.9 mmol/L — ABNORMAL LOW (ref 3.5–5.1)
Potassium: 2.9 mmol/L — ABNORMAL LOW (ref 3.5–5.1)
Potassium: 3.2 mmol/L — ABNORMAL LOW (ref 3.5–5.1)
Potassium: 3.3 mmol/L — ABNORMAL LOW (ref 3.5–5.1)
Potassium: 3.8 mmol/L (ref 3.5–5.1)

## 2021-10-06 LAB — PHOSPHORUS
Phosphorus: 1.6 mg/dL — ABNORMAL LOW (ref 2.5–4.6)
Phosphorus: 3 mg/dL (ref 2.5–4.6)
Phosphorus: 1.6 mg/dL — ABNORMAL LOW (ref 2.5–4.6)

## 2021-10-06 LAB — CBC
HCT: 31.1 % — ABNORMAL LOW (ref 36.0–46.0)
Hemoglobin: 11.4 g/dL — ABNORMAL LOW (ref 12.0–15.0)
MCH: 31.2 pg (ref 26.0–34.0)
MCHC: 36.7 g/dL — ABNORMAL HIGH (ref 30.0–36.0)
MCV: 85.2 fL (ref 80.0–100.0)
Platelets: 247 10*3/uL (ref 150–400)
RBC: 3.65 MIL/uL — ABNORMAL LOW (ref 3.87–5.11)
RDW: 11.9 % (ref 11.5–15.5)
WBC: 23.7 10*3/uL — ABNORMAL HIGH (ref 4.0–10.5)
nRBC: 0 % (ref 0.0–0.2)

## 2021-10-06 LAB — COMPREHENSIVE METABOLIC PANEL
ALT: 13 U/L (ref 0–44)
ALT: 16 U/L (ref 0–44)
ALT: 19 U/L (ref 0–44)
AST: 16 U/L (ref 15–41)
AST: 17 U/L (ref 15–41)
AST: 22 U/L (ref 15–41)
Albumin: 3 g/dL — ABNORMAL LOW (ref 3.5–5.0)
Albumin: 3.3 g/dL — ABNORMAL LOW (ref 3.5–5.0)
Albumin: 3 g/dL — ABNORMAL LOW (ref 3.5–5.0)
Alkaline Phosphatase: 41 U/L (ref 38–126)
Alkaline Phosphatase: 48 U/L (ref 38–126)
Alkaline Phosphatase: 50 U/L (ref 38–126)
Anion gap: 7 (ref 5–15)
Anion gap: 9 (ref 5–15)
Anion gap: 7 (ref 5–15)
BUN: <5 mg/dL — ABNORMAL LOW (ref 6–20)
BUN: <5 mg/dL — ABNORMAL LOW (ref 6–20)
BUN: <5 mg/dL — ABNORMAL LOW (ref 6–20)
CO2: 21 mmol/L — ABNORMAL LOW (ref 22–32)
CO2: 23 mmol/L (ref 22–32)
CO2: 25 mmol/L (ref 22–32)
Calcium: 6.4 mg/dL — CL (ref 8.9–10.3)
Calcium: 7.3 mg/dL — ABNORMAL LOW (ref 8.9–10.3)
Calcium: 7.6 mg/dL — ABNORMAL LOW (ref 8.9–10.3)
Chloride: 91 mmol/L — ABNORMAL LOW (ref 98–111)
Chloride: 93 mmol/L — ABNORMAL LOW (ref 98–111)
Chloride: 105 mmol/L (ref 98–111)
Creatinine, Ser: 0.56 mg/dL (ref 0.44–1.00)
Creatinine, Ser: 0.47 mg/dL (ref 0.44–1.00)
Creatinine, Ser: 0.67 mg/dL (ref 0.44–1.00)
GFR, Estimated: >60 mL/min (ref >60)
GFR, Estimated: >60 mL/min (ref >60)
GFR, Estimated: >60 mL/min (ref >60)
Glucose, Bld: 269 mg/dL — ABNORMAL HIGH (ref 70–99)
Glucose, Bld: 128 mg/dL — ABNORMAL HIGH (ref 70–99)
Glucose, Bld: 155 mg/dL — ABNORMAL HIGH (ref 70–99)
Potassium: 2.7 mmol/L — CL (ref 3.5–5.1)
Potassium: 2.8 mmol/L — ABNORMAL LOW (ref 3.5–5.1)
Potassium: 3.5 mmol/L (ref 3.5–5.1)
Sodium: 119 mmol/L — CL (ref 135–145)
Sodium: 125 mmol/L — ABNORMAL LOW (ref 135–145)
Sodium: 137 mmol/L (ref 135–145)
Total Bilirubin: 0.5 mg/dL (ref 0.3–1.2)
Total Bilirubin: 0.4 mg/dL (ref 0.3–1.2)
Total Bilirubin: 0.4 mg/dL (ref 0.3–1.2)
Total Protein: 5.2 g/dL — ABNORMAL LOW (ref 6.5–8.1)
Total Protein: 5.8 g/dL — ABNORMAL LOW (ref 6.5–8.1)
Total Protein: 5.8 g/dL — ABNORMAL LOW (ref 6.5–8.1)

## 2021-10-06 LAB — POCT I-STAT 7, (LYTES, BLD GAS, ICA,H+H)
Acid-base deficit: 7 mmol/L — ABNORMAL HIGH (ref 0.0–2.0)
Bicarbonate: 17.8 mmol/L — ABNORMAL LOW (ref 20.0–28.0)
Calcium, Ion: 1.01 mmol/L — ABNORMAL LOW (ref 1.15–1.40)
HCT: 34 % — ABNORMAL LOW (ref 36.0–46.0)
Hemoglobin: 11.6 g/dL — ABNORMAL LOW (ref 12.0–15.0)
O2 Saturation: 93 %
Patient temperature: 97.7
Potassium: 2.9 mmol/L — ABNORMAL LOW (ref 3.5–5.1)
Sodium: 125 mmol/L — ABNORMAL LOW (ref 135–145)
TCO2: 19 mmol/L — ABNORMAL LOW (ref 22–32)
pCO2 arterial: 32.6 mmHg (ref 32–48)
pH, Arterial: 7.343 — ABNORMAL LOW (ref 7.35–7.45)
pO2, Arterial: 68 mmHg — ABNORMAL LOW (ref 83–108)

## 2021-10-06 LAB — MAGNESIUM
Magnesium: 1.3 mg/dL — ABNORMAL LOW (ref 1.7–2.4)
Magnesium: 2.1 mg/dL (ref 1.7–2.4)

## 2021-10-06 LAB — CORTISOL: Cortisol, Plasma: 28.5 ug/dL

## 2021-10-06 MED ORDER — CALCIUM GLUCONATE-NACL 2-0.675 GM/100ML-% IV SOLN
2.0000 g | Freq: Once | INTRAVENOUS | Status: AC
  Administered 2021-10-06: 2000 mg via INTRAVENOUS
  Filled 2021-10-06: qty 100

## 2021-10-06 MED ORDER — SODIUM CHLORIDE 0.9 % IV SOLN
4.0000 [IU]/kg/h | INTRAVENOUS | Status: DC
  Administered 2021-10-06: 4 [IU]/kg/h via INTRAVENOUS
  Administered 2021-10-06: 6 [IU]/kg/h via INTRAVENOUS
  Administered 2021-10-06: 1 [IU]/kg/h via INTRAVENOUS
  Filled 2021-10-06 (×3): qty 10

## 2021-10-06 MED ORDER — MAGNESIUM SULFATE 2 GM/50ML IV SOLN
2.0000 g | Freq: Once | INTRAVENOUS | Status: AC
  Administered 2021-10-06: 2 g via INTRAVENOUS
  Filled 2021-10-06: qty 50

## 2021-10-06 MED ORDER — DEXTROSE 10% IV SOLUTION (ML/KG/HR)
3.0000 mL/kg/h | INTRAVENOUS | Status: DC
  Administered 2021-10-06: 5 mL/kg/h via INTRAVENOUS
  Administered 2021-10-06: 3 mL/kg/h via INTRAVENOUS
  Filled 2021-10-06 (×8): qty 1000

## 2021-10-06 MED ORDER — PROCHLORPERAZINE EDISYLATE 10 MG/2ML IJ SOLN: 10.0000 mg | INTRAMUSCULAR | Status: DC | PRN

## 2021-10-06 MED ORDER — DEXTROSE 20 % IV SOLN
INTRAVENOUS | Status: DC
  Filled 2021-10-06 (×11): qty 500

## 2021-10-06 MED ORDER — POTASSIUM CHLORIDE 10 MEQ/50ML IV SOLN
10.0000 meq | INTRAVENOUS | Status: AC
  Administered 2021-10-06 (×4): 10 meq via INTRAVENOUS
  Filled 2021-10-06 (×4): qty 50

## 2021-10-06 MED ORDER — POTASSIUM PHOSPHATES 15 MMOLE/5ML IV SOLN
30.0000 mmol | Freq: Once | INTRAVENOUS | Status: AC
  Administered 2021-10-06: 30 mmol via INTRAVENOUS
  Filled 2021-10-06: qty 10

## 2021-10-06 MED ORDER — POTASSIUM CHLORIDE 10 MEQ/50ML IV SOLN
10.0000 meq | INTRAVENOUS | Status: AC
  Administered 2021-10-06 (×2): 10 meq via INTRAVENOUS
  Filled 2021-10-06 (×2): qty 50

## 2021-10-06 MED ORDER — ENOXAPARIN SODIUM 40 MG/0.4ML IJ SOSY
40.0000 mg | PREFILLED_SYRINGE | INTRAMUSCULAR | Status: DC
  Administered 2021-10-06 – 2021-10-11 (×6): 40 mg via SUBCUTANEOUS
  Filled 2021-10-06 (×6): qty 0.4

## 2021-10-06 MED ORDER — MAGNESIUM SULFATE 4 GM/100ML IV SOLN
4.0000 g | Freq: Once | INTRAVENOUS | Status: AC
  Administered 2021-10-06: 4 g via INTRAVENOUS
  Filled 2021-10-06: qty 100

## 2021-10-06 MED ORDER — CHLORHEXIDINE GLUCONATE CLOTH 2 % EX PADS
6.0000 | MEDICATED_PAD | Freq: Every day | CUTANEOUS | Status: DC
  Administered 2021-10-06 – 2021-10-11 (×6): 6 via TOPICAL

## 2021-10-06 MED ORDER — POTASSIUM CHLORIDE 10 MEQ/50ML IV SOLN
10.0000 meq | INTRAVENOUS | Status: AC
  Administered 2021-10-06 (×2): 10 meq via INTRAVENOUS
  Filled 2021-10-06: qty 50

## 2021-10-06 MED ORDER — DOPAMINE-DEXTROSE 3.2-5 MG/ML-% IV SOLN
0.0000 ug/kg/min | INTRAVENOUS | Status: DC
  Administered 2021-10-06: 15 ug/kg/min via INTRAVENOUS
  Administered 2021-10-06: 10 ug/kg/min via INTRAVENOUS
  Administered 2021-10-06: 13 ug/kg/min via INTRAVENOUS
  Administered 2021-10-07: 14 ug/kg/min via INTRAVENOUS
  Administered 2021-10-08: 8 ug/kg/min via INTRAVENOUS
  Administered 2021-10-08: 14 ug/kg/min via INTRAVENOUS
  Administered 2021-10-09: 5 ug/kg/min via INTRAVENOUS
  Filled 2021-10-06 (×6): qty 250

## 2021-10-06 MED ORDER — POTASSIUM CHLORIDE 10 MEQ/50ML IV SOLN
10.0000 meq | INTRAVENOUS | Status: AC
  Administered 2021-10-06 (×4): 10 meq via INTRAVENOUS
  Filled 2021-10-06 (×3): qty 50

## 2021-10-06 NOTE — Progress Notes (Signed)
Date and time results received: 10/06/21 1007 ? ? ?Test: K+ ?Critical Value: 2.6 ? ?Name of Provider Notified: Dr. Halford Chessman  ? ?Orders Received? Or Actions Taken?: see new orders   ? ? ?Date and time results received: 10/06/21 1107  ? ?Test: K+ ?Critical Value: 2.6  ? ?Name of Provider Notified: Dr. Halford Chessman  ? ?Orders Received? Or Actions Taken?:4 runs of K+ already added and being given  ?

## 2021-10-06 NOTE — Progress Notes (Signed)
Date and time results received: 10/06/21 1700 ? ? ?Test: K+  ?Critical Value: 2.8 ? ?Name of Provider Notified: Dr Halford Chessman  ? ?Orders Received? Or Actions Taken?: 4K+ runs central line  ?

## 2021-10-06 NOTE — Progress Notes (Signed)
Mg 1.3 ?Replaced per protocol  ?

## 2021-10-06 NOTE — Progress Notes (Addendum)
eLink Physician-Brief Progress Note ?Patient Name: Leslie Elliott ?DOB: Dec 04, 1975 ?MRN: [id number] ? ? ?Date of Service ? 10/06/2021  ?HPI/Events of Note ? E link asked to follow labs. K is 3.3 and patient still has 1 more run of Kcl to infuse. Mag is 2.1. CMP and phos are ordered for 11 pm   ?eICU Interventions ? Plz Notify us when those result.   ? ? ? ?Intervention Category ?Major Interventions: Electrolyte abnormality - evaluation and management ? ?[name] ?10/06/2021, 9:16 PM ? ?Addendum at midnight: ?CMP and phos notified ?K improved to 3.5 and RN has finished prior ordered potassium  ?RN was about to send another K since it is ordered q2  ?D/w RN. Patient has been off the insulin drip since 430 am ?She is currently on D20 and NS - d20 is being titrated based on glucose levels ?Replace with 30 mmol of IV K phos : this will give around 45 meq of potassium as well  ?Check next BMP in about 4 hours - so placed order for 430 am . RN will do CBC BMP and mag at 430 am  ?Since insulin drip also off and K has improved, will discontinue Q2 potassium checks at this time ?Will need phos to be checked once the K phos has infused  ?Noted sodium of 137. Admission sodium was `141 so suspect the one documented as 119/125 were not accurate or drawn with high dose dextrose infusing ?Also ordered 1 gram cal gluconate since corrected calcium is 8.4 ? ?

## 2021-10-06 NOTE — Consult Note (Signed)
[facility] Face-to-Face Psychiatry Consult   Reason for Consult: Overdose on medication Referring Physician: Freda [place] Patient Identification: Leslie Elliott MRN:  [id number] Principal Diagnosis: Drug overdose Diagnosis:  Principal Problem:   Drug overdose Active Problems:   MDD (major depressive disorder), recurrent, severe, with psychosis ([place])  Total Time Spent in Direct Patient Care:  I personally spent 50 minutes on the unit in direct patient care. The direct patient care time included face-to-face time with the patient, reviewing the patient's chart, communicating with other professionals, and coordinating care. Greater than 50% of this time was spent in counseling or coordinating care with the patient regarding goals of hospitalization, psycho-education, and discharge planning needs.  Subjective: "I did not want to die."  HPI:  Leslie Elliott is a 46 y.o. female patient with a history of depression and anxiety who was admitted to the hospital following a suicide attempt with overdose on Abilify, Ativan, and propranolol.  She was transferred to the ICU with bradycardia.  On evaluation patient is groggy but able to answer simple questions.  She states that she was getting messages from [facility] that she should not be alive, nobody wanted her alive, and she should die.  Patient acknowledges that she did not want to die when she overdosed.  She denies current suicidal ideation, plan or intent.  She states she is no longer getting messages from [place] at this time.  She acknowledges that she will be able to tell nursing staff should she start to have auditory or visual hallucinations.  Nursing is at bedside and note that patient has continued to be sedated and bradycardic with nausea.  She does have episodes of mild agitation as medication is wearing off, but overall her status is improving.  Patient's mother, Lavella Lemons phone [phone]) is at bedside for collateral. Patient's mother  states that approximately 1 week ago Leslie Elliott began listening to [place] and began perseverating that she was a bad person.  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  Mother would also find her disoriented and staring out the window for long periods of time.  She told her mother that [name] was telling her that she was a bad person, and mother encouraged her to make an appointment with her psychiatrist, Dr. Latricia Heft which she had on Friday, 09/28/2021.  Patient and psychiatrist decided to do a trial of [place] which she started on Monday or Tuesday of this past week.  Mother states that she has been staying with the family at their home while patient's husband is at work to ensure that patient is safe.  Mother states that on Tuesday following [facility] treatment patient began getting messages from [place] telling her that she should not be here and that nobody wants her here.  On Wednesday she asked her mother if she was the antichrist, and on Thursday she commented that she was "tired of this fight".   Mother is uncertain if patient shared her hallucinations with her psychiatrist, noting that patient was afraid she would be readmitted to the hospital for a psychiatric admission. Mother states that patient is not currently working with a therapist, but has been encouraged to do so by her psychiatrist as well as her daughter psychiatrist.  Past Psychiatric History: depression, anxiety, multiple suicide attempts and ECT treatment Patient was last seen by psychiatry 03/05/2021.  At that time she reported depression and anxiety with threats  of suicide and was admitted to inpatient psychiatry.  She had been experiencing high level of depression with suicidal ideations, multiple attempts over the weekend with her husband intervening.   Prior episode to this level was in 2007 which required hospitalization and ECT to stabilize.     Risk to Self:  yes Risk to Others:  none Prior Inpatient Therapy:  [facility] 2007 with ECT, 2022- discharged on Abilify 5 mg and Wellbutrin XL 300 mg Prior Outpatient Therapy:  Dr Latricia Heft  Past Medical History:  Past Medical History:  Diagnosis Date   Anemia    Anxiety    Depression    Ovarian cyst     Past Surgical History:  Procedure Laterality Date   ECT TREATMENTS     ROBOTIC ASSISTED BILATERAL SALPINGO OOPHERECTOMY Left 10/30/2020   Procedure: XI ROBOTIC ASSISTED SALPINGO OOPHORECTOMY;  Surgeon: Benjaman Kindler, MD;  Location: [facility] ORS;  Service: Gynecology;  Laterality: Left;   ROBOTIC ASSISTED LAPAROSCOPIC OVARIAN CYSTECTOMY Bilateral 10/30/2020   Procedure: XI ROBOTIC ASSISTED LAPAROSCOPIC OVARIAN CYSTECTOMY;  Surgeon: Benjaman Kindler, MD;  Location: [facility] ORS;  Service: Gynecology;  Laterality: Bilateral;   TUBAL LIGATION  2002   XI ROBOTIC ASSISTED SALPINGECTOMY Right 10/30/2020   Procedure: XI ROBOTIC ASSISTED SALPINGECTOMY;  Surgeon: Benjaman Kindler, MD;  Location: [facility] ORS;  Service: Gynecology;  Laterality: Right;   Family History:  Family History  Problem Relation Age of Onset   Breast cancer Neg Hx    Family Psychiatric  History: daughter with depression and anxiety   Social History:  Social History   Substance and Sexual Activity  Alcohol Use Yes   Comment: occassional     Social History   Substance and Sexual Activity  Drug Use Never    Social History   Socioeconomic History   Marital status: Married    Spouse name: Herbie Baltimore   Number of children: Not on file   Years of education: Not on file   Highest education level: Not on file  Occupational History   Not on file  Tobacco Use   Smoking status: Never   Smokeless tobacco: Never  Vaping Use   Vaping Use: Never used  Substance and Sexual Activity   Alcohol use: Yes    Comment: occassional   Drug use: Never   Sexual activity: Not on file  Other Topics Concern   Not on file  Social History  Narrative   Not on file   Social Determinants of Health   Financial Resource Strain: Not on file  Food Insecurity: Not on file  Transportation Needs: Not on file  Physical Activity: Not on file  Stress: Not on file  Social Connections: Not on file   Additional Social History: Lives with husband and her children    Allergies:   Allergies  Allergen Reactions   Zofran [Ondansetron Hcl] Rash    Labs:  Results for orders placed or performed during the hospital encounter of 10/05/21 (from the past 48 hour(s))  CBG monitoring, ED     Status: Abnormal   Collection Time: 10/05/21  9:55 AM  Result Value Ref Range   Glucose-Capillary 215 (H) 70 - 99 mg/dL    Comment: Glucose reference range applies only to samples taken after fasting for at least 8 hours.  CBC with Differential     Status: Abnormal   Collection Time: 10/05/21 10:26 AM  Result Value Ref Range   WBC 13.4 (H) 4.0 - 10.5 K/uL   RBC 3.73 (L) 3.87 - 5.11  MIL/uL   Hemoglobin 11.5 (L) 12.0 - 15.0 g/dL   HCT 34.4 (L) 36.0 - 46.0 %   MCV 92.2 80.0 - 100.0 fL   MCH 30.8 26.0 - 34.0 pg   MCHC 33.4 30.0 - 36.0 g/dL   RDW 12.3 11.5 - 15.5 %   Platelets 430 (H) 150 - 400 K/uL   nRBC 0.0 0.0 - 0.2 %   Neutrophils Relative % 75 %   Neutro Abs 10.2 (H) 1.7 - 7.7 K/uL   Lymphocytes Relative 15 %   Lymphs Abs 2.0 0.7 - 4.0 K/uL   Monocytes Relative 7 %   Monocytes Absolute 0.9 0.1 - 1.0 K/uL   Eosinophils Relative 1 %   Eosinophils Absolute 0.2 0.0 - 0.5 K/uL   Basophils Relative 1 %   Basophils Absolute 0.1 0.0 - 0.1 K/uL   Immature Granulocytes 1 %   Abs Immature Granulocytes 0.07 0.00 - 0.07 K/uL    Comment: Performed at [facility] [address] [place], [place] [address]  Comprehensive metabolic panel     Status: Abnormal   Collection Time: 10/05/21 10:26 AM  Result Value Ref Range   Sodium 138 135 - 145 mmol/L   Potassium 4.0 3.5 - 5.1 mmol/L   Chloride 108 98 - 111 mmol/L   CO2 22 22 - 32 mmol/L    Glucose, Bld 226 (H) 70 - 99 mg/dL    Comment: Glucose reference range applies only to samples taken after fasting for at least 8 hours.   BUN 11 6 - 20 mg/dL   Creatinine, Ser 0.96 0.44 - 1.00 mg/dL   Calcium 7.8 (L) 8.9 - 10.3 mg/dL   Total Protein 5.6 (L) 6.5 - 8.1 g/dL   Albumin 3.3 (L) 3.5 - 5.0 g/dL   AST 14 (L) 15 - 41 U/L   ALT 10 0 - 44 U/L   Alkaline Phosphatase 40 38 - 126 U/L   Total Bilirubin 0.2 (L) 0.3 - 1.2 mg/dL   GFR, Estimated >60 >60 mL/min    Comment: (NOTE) Calculated using the CKD-EPI Creatinine Equation (2021)    Anion gap 8 5 - 15    Comment: Performed at [facility], [place] [address] [place], [place] [address]  TSH     Status: None   Collection Time: 10/05/21 10:26 AM  Result Value Ref Range   TSH 0.577 0.350 - 4.500 uIU/mL    Comment: Performed by a 3rd Generation assay with a functional sensitivity of <=0.01 uIU/mL. Performed at [facility], [place] [address] [place], [place] [address]   Magnesium     Status: Abnormal   Collection Time: 10/05/21 10:26 AM  Result Value Ref Range   Magnesium 1.6 (L) 1.7 - 2.4 mg/dL    Comment: Performed at [facility] [address] [place], [place] [address]  Acetaminophen level     Status: Abnormal   Collection Time: 10/05/21 10:27 AM  Result Value Ref Range   Acetaminophen (Tylenol), Serum <10 (L) 10 - 30 ug/mL    Comment: (NOTE) Therapeutic concentrations vary significantly. A range of 10-30 ug/mL  may be an effective concentration for many patients. However, some  are best treated at concentrations outside of this range. Acetaminophen concentrations >150 ug/mL at 4 hours after ingestion  and >50 ug/mL at 12 hours after ingestion are often associated with  toxic reactions.  Performed at [facility], [place] [address] [place], [place] [address]  Salicylate level     Status: Abnormal   Collection Time: 10/05/21 10:27 AM  Result Value Ref Range   Salicylate Lvl <7.0 (L) 7.0 - 30.0  mg/dL    Comment: Performed at [facility] [address], [place], [place] [address]  Ethanol     Status: None   Collection Time: 10/05/21 10:27 AM  Result Value Ref Range   Alcohol, Ethyl (B) <10 <10 mg/dL    Comment: (NOTE) Lowest detectable limit for serum alcohol is 10 mg/dL.  For medical purposes only. Performed at [facility], [place] [address] [place], [place] [address]   I-Stat venous blood gas, [facility] ED)     Status: Abnormal   Collection Time: 10/05/21 10:37 AM  Result Value Ref Range   pH, Ven 7.361 7.25 - 7.43   pCO2, Ven 39.6 (L) 44 - 60 mmHg   pO2, Ven 78 (H) 32 - 45 mmHg   Bicarbonate 22.4 20.0 - 28.0 mmol/L   TCO2 24 22 - 32 mmol/L   O2 Saturation 95 %   Acid-base deficit 3.0 (H) 0.0 - 2.0 mmol/L   Sodium 141 135 - 145 mmol/L   Potassium 4.0 3.5 - 5.1 mmol/L   Calcium, Ion 1.06 (L) 1.15 - 1.40 mmol/L   HCT 32.0 (L) 36.0 - 46.0 %   Hemoglobin 10.9 (L) 12.0 - 15.0 g/dL   Sample type VENOUS   Lactic acid, plasma     Status: None   Collection Time: 10/05/21 10:57 AM  Result Value Ref Range   Lactic Acid, Venous 1.7 0.5 - 1.9 mmol/L    Comment: Performed at [facility], [place] [address] [place], [place] [address]  Resp Panel by RT-PCR (Flu A&B, Covid) Nasopharyngeal Swab     Status: None   Collection Time: 10/05/21 11:07 AM   Specimen: Nasopharyngeal Swab; Nasopharyngeal(NP) swabs in vial transport medium  Result Value Ref Range   SARS Coronavirus 2 by RT PCR NEGATIVE NEGATIVE    Comment: (NOTE) SARS-CoV-2 target nucleic acids are NOT DETECTED.  The SARS-CoV-2 RNA is generally detectable in upper respiratory specimens during the acute phase of infection. The lowest concentration of SARS-CoV-2 viral copies this assay can detect is 138 copies/mL. A negative result does not preclude SARS-Cov-2 infection and should not be used as the sole basis for treatment or other patient management decisions. A negative result may occur with  improper  specimen collection/handling, submission of specimen other than nasopharyngeal swab, presence of viral mutation(s) within the areas targeted by this assay, and inadequate number of viral copies(<138 copies/mL). A negative result must be combined with clinical observations, patient history, and epidemiological information. The expected result is Negative.  Fact Sheet for Patients:  EntrepreneurPulse.com.au  Fact Sheet for Healthcare Providers:  IncredibleEmployment.be  This test is no t yet approved or cleared by the [place] FDA and  has been authorized for detection and/or diagnosis of SARS-CoV-2 by FDA under an Emergency Use Authorization (EUA). This EUA will remain  in effect (meaning this test can be used) for the duration of the COVID-19 declaration under Section 564(b)(1) of the Act, 21 U.S.C.section 360bbb-3(b)(1), unless the authorization is terminated  or revoked sooner.       Influenza A by PCR NEGATIVE NEGATIVE   Influenza B by PCR NEGATIVE NEGATIVE    Comment: (NOTE) The Xpert Xpress SARS-CoV-2/FLU/RSV plus assay is intended as an aid in the diagnosis of influenza from Nasopharyngeal swab specimens and should not be  used as a sole basis for treatment. Nasal washings and aspirates are unacceptable for Xpert Xpress SARS-CoV-2/FLU/RSV testing.  Fact Sheet for Patients: EntrepreneurPulse.com.au  Fact Sheet for Healthcare Providers: IncredibleEmployment.be  This test is not yet approved or cleared by the [place] FDA and has been authorized for detection and/or diagnosis of SARS-CoV-2 by FDA under an Emergency Use Authorization (EUA). This EUA will remain in effect (meaning this test can be used) for the duration of the COVID-19 declaration under Section 564(b)(1) of the Act, 21 U.S.C. section 360bbb-3(b)(1), unless the authorization is terminated or revoked.  Performed at [facility], [place] [address] [place], [place] [address]   POC CBG, ED     Status: Abnormal   Collection Time: 10/05/21 11:14 AM  Result Value Ref Range   Glucose-Capillary 272 (H) 70 - 99 mg/dL    Comment: Glucose reference range applies only to samples taken after fasting for at least 8 hours.  CBG monitoring, ED     Status: Abnormal   Collection Time: 10/05/21 11:48 AM  Result Value Ref Range   Glucose-Capillary 246 (H) 70 - 99 mg/dL    Comment: Glucose reference range applies only to samples taken after fasting for at least 8 hours.  Lactic acid, plasma     Status: Abnormal   Collection Time: 10/05/21 12:18 PM  Result Value Ref Range   Lactic Acid, Venous 3.6 (HH) 0.5 - 1.9 mmol/L    Comment: CRITICAL RESULT CALLED TO, READ BACK BY AND VERIFIED WITH: MAERLENDER,N RN @ 1325 10/05/21 LEONARD,A Performed at [facility], [place] [address], [place], [place] [address]   Magnesium     Status: Abnormal   Collection Time: 10/05/21 12:18 PM  Result Value Ref Range   Magnesium 1.4 (L) 1.7 - 2.4 mg/dL    Comment: Performed at [facility] [address], [place], [place] [address]  Phosphorus     Status: Abnormal   Collection Time: 10/05/21 12:18 PM  Result Value Ref Range   Phosphorus 1.6 (L) 2.5 - 4.6 mg/dL    Comment: Performed at [facility] [address], [place], [place] [address]  Cortisol     Status: None   Collection Time: 10/05/21 12:18 PM  Result Value Ref Range   Cortisol, Plasma 48.4 ug/dL    Comment: (NOTE) AM    6.7 - 22.6 ug/dL PM   <10.0       ug/dL Performed at [facility] [address], [place], [place] [address]   Protime-INR     Status: Abnormal   Collection Time: 10/05/21 12:18 PM  Result Value Ref Range   Prothrombin Time 15.4 (H) 11.4 - 15.2 seconds   INR 1.2 0.8 - 1.2    Comment: (NOTE) INR goal varies based on device and disease states. Performed at [facility], [place] [address], [place], [place] [address]   APTT      Status: None   Collection Time: 10/05/21 12:18 PM  Result Value Ref Range   aPTT 27 24 - 36 seconds    Comment: Performed at [facility] [address], [place],  [address]  CBG monitoring, ED     Status: Abnormal   Collection Time: 10/05/21 12:26 PM  Result Value Ref Range   Glucose-Capillary 173 (H) 70 - 99 mg/dL    Comment: Glucose reference range applies only to samples taken after fasting for at least 8 hours.  I-Stat arterial blood gas, ED     Status: Abnormal   Collection Time: 10/05/21 12:33 PM  Result Value Ref Range   pH, Arterial 7.321 (L) 7.35 - 7.45   pCO2 arterial 39.6 32 - 48 mmHg   pO2, Arterial 145 (H) 83 - 108 mmHg   Bicarbonate 20.4 20.0 - 28.0 mmol/L   TCO2 22 22 - 32 mmol/L   O2 Saturation 99 %   Acid-base deficit 5.0 (H) 0.0 - 2.0 mmol/L   Sodium 141 135 - 145 mmol/L   Potassium 3.4 (L) 3.5 - 5.1 mmol/L   Calcium, Ion 1.16 1.15 - 1.40 mmol/L   HCT 29.0 (L) 36.0 - 46.0 %   Hemoglobin 9.9 (L) 12.0 - 15.0 g/dL   Sample type ARTERIAL   MRSA Next Gen by PCR, Nasal     Status: None   Collection Time: 10/05/21 12:45 PM   Specimen: Nasal Mucosa; Nasal Swab  Result Value Ref Range   MRSA by PCR Next Gen NOT DETECTED NOT DETECTED    Comment: (NOTE) The GeneXpert MRSA Assay (FDA approved for NASAL specimens only), is one component of a comprehensive MRSA colonization surveillance program. It is not intended to diagnose MRSA infection nor to guide or monitor treatment for MRSA infections. Test performance is not FDA approved in patients less than 55 years old. Performed at [facility], [place] [address] [place], [place] [address]   Glucose, capillary     Status: Abnormal   Collection Time: 10/05/21  1:04 PM  Result Value Ref Range   Glucose-Capillary 129 (H) 70 - 99 mg/dL    Comment: Glucose reference range applies only to samples taken after fasting for at least 8 hours.  Potassium     Status: None   Collection Time: 10/05/21  1:06 PM   Result Value Ref Range   Potassium 3.5 3.5 - 5.1 mmol/L    Comment: Performed at [facility], [place] [address] [place], [place] [address]  HIV Antibody (routine testing w rflx)     Status: None   Collection Time: 10/05/21  1:06 PM  Result Value Ref Range   HIV Screen 4th Generation wRfx Non Reactive Non Reactive    Comment: Performed at [facility], [place] [address] [place], [place] [address]  CBC     Status: Abnormal   Collection Time: 10/05/21  1:06 PM  Result Value Ref Range   WBC 24.1 (H) 4.0 - 10.5 K/uL   RBC 3.93 3.87 - 5.11 MIL/uL   Hemoglobin 11.9 (L) 12.0 - 15.0 g/dL   HCT 36.0 36.0 - 46.0 %   MCV 91.6 80.0 - 100.0 fL   MCH 30.3 26.0 - 34.0 pg   MCHC 33.1 30.0 - 36.0 g/dL   RDW 12.1 11.5 - 15.5 %   Platelets 348 150 - 400 K/uL   nRBC 0.0 0.0 - 0.2 %    Comment: Performed at [facility], [place] [address], [place],  [address]  Glucose, capillary     Status: Abnormal   Collection Time: 10/05/21  1:31 PM  Result Value Ref Range   Glucose-Capillary 115 (H) 70 - 99 mg/dL    Comment: Glucose reference range applies only to samples taken after fasting for at least 8 hours.  Glucose, capillary     Status: Abnormal   Collection Time: 10/05/21  2:01 PM  Result Value Ref Range   Glucose-Capillary 183 (H) 70 - 99 mg/dL  Comment: Glucose reference range applies only to samples taken after fasting for at least 8 hours.  Glucose, capillary     Status: Abnormal   Collection Time: 10/05/21  2:32 PM  Result Value Ref Range   Glucose-Capillary 176 (H) 70 - 99 mg/dL    Comment: Glucose reference range applies only to samples taken after fasting for at least 8 hours.  Glucose, capillary     Status: Abnormal   Collection Time: 10/05/21  3:00 PM  Result Value Ref Range   Glucose-Capillary 215 (H) 70 - 99 mg/dL    Comment: Glucose reference range applies only to samples taken after fasting for at least 8 hours.  Comprehensive metabolic panel     Status: Abnormal    Collection Time: 10/05/21  3:19 PM  Result Value Ref Range   Sodium 133 (L) 135 - 145 mmol/L   Potassium 5.6 (H) 3.5 - 5.1 mmol/L   Chloride 107 98 - 111 mmol/L   CO2 18 (L) 22 - 32 mmol/L   Glucose, Bld 162 (H) 70 - 99 mg/dL    Comment: Glucose reference range applies only to samples taken after fasting for at least 8 hours.   BUN 11 6 - 20 mg/dL   Creatinine, Ser 0.90 0.44 - 1.00 mg/dL   Calcium 7.4 (L) 8.9 - 10.3 mg/dL   Total Protein 5.2 (L) 6.5 - 8.1 g/dL   Albumin 3.0 (L) 3.5 - 5.0 g/dL   AST 22 15 - 41 U/L   ALT 13 0 - 44 U/L   Alkaline Phosphatase 39 38 - 126 U/L   Total Bilirubin 0.6 0.3 - 1.2 mg/dL   GFR, Estimated >60 >60 mL/min    Comment: (NOTE) Calculated using the CKD-EPI Creatinine Equation (2021)    Anion gap 8 5 - 15    Comment: Performed at [facility], [place] [address] [place], [place] [address]  Magnesium     Status: Abnormal   Collection Time: 10/05/21  3:19 PM  Result Value Ref Range   Magnesium 3.4 (H) 1.7 - 2.4 mg/dL    Comment: Performed at [facility] [address], [place], [place] [address]  Lactic acid, plasma     Status: Abnormal   Collection Time: 10/05/21  3:19 PM  Result Value Ref Range   Lactic Acid, Venous 4.9 (HH) 0.5 - 1.9 mmol/L    Comment: CRITICAL VALUE NOTED.  VALUE IS CONSISTENT WITH PREVIOUSLY REPORTED AND CALLED VALUE. Performed at [facility], [place] [address] [place], [place] [address]   Phosphorus     Status: Abnormal   Collection Time: 10/05/21  3:19 PM  Result Value Ref Range   Phosphorus 1.0 (LL) 2.5 - 4.6 mg/dL    Comment: CRITICAL RESULT CALLED TO, READ BACK BY AND VERIFIED WITH: [place] 0938 10/05/2021 BY R VERAAR Performed at [facility], [place] [address], [place], [place] [address]   Glucose, capillary     Status: Abnormal   Collection Time: 10/05/21  3:32 PM  Result Value Ref Range   Glucose-Capillary 153 (H) 70 - 99 mg/dL    Comment: Glucose reference range applies only to  samples taken after fasting for at least 8 hours.  Glucose, capillary     Status: Abnormal   Collection Time: 10/05/21  4:10 PM  Result Value Ref Range   Glucose-Capillary 168 (H) 70 - 99 mg/dL    Comment: Glucose reference range applies only to samples taken  after fasting for at least 8 hours.  Urinalysis, Routine w reflex microscopic Urine, Catheterized     Status: Abnormal   Collection Time: 10/05/21  4:14 PM  Result Value Ref Range   Color, Urine YELLOW YELLOW   APPearance HAZY (A) CLEAR   Specific Gravity, Urine 1.022 1.005 - 1.030   pH 5.0 5.0 - 8.0   Glucose, UA >=500 (A) NEGATIVE mg/dL   Hgb urine dipstick NEGATIVE NEGATIVE   Bilirubin Urine NEGATIVE NEGATIVE   Ketones, ur 5 (A) NEGATIVE mg/dL   Protein, ur NEGATIVE NEGATIVE mg/dL   Nitrite NEGATIVE NEGATIVE   Leukocytes,Ua NEGATIVE NEGATIVE   RBC / HPF 0-5 0 - 5 RBC/hpf   WBC, UA 0-5 0 - 5 WBC/hpf   Bacteria, UA RARE (A) NONE SEEN   Squamous Epithelial / LPF 0-5 0 - 5   Mucus PRESENT    Hyaline Casts, UA PRESENT     Comment: Performed at [facility], [address], [place], [place] [address]  Rapid urine drug screen (hospital performed)     Status: None   Collection Time: 10/05/21  4:14 PM  Result Value Ref Range   Opiates NONE DETECTED NONE DETECTED   Cocaine NONE DETECTED NONE DETECTED   Benzodiazepines NONE DETECTED NONE DETECTED   Amphetamines NONE DETECTED NONE DETECTED   Tetrahydrocannabinol NONE DETECTED NONE DETECTED   Barbiturates NONE DETECTED NONE DETECTED    Comment: (NOTE) DRUG SCREEN FOR MEDICAL PURPOSES ONLY.  IF CONFIRMATION IS NEEDED FOR ANY PURPOSE, NOTIFY LAB WITHIN 5 DAYS.  LOWEST DETECTABLE LIMITS FOR URINE DRUG SCREEN Drug Class                     Cutoff (ng/mL) Amphetamine and metabolites    1000 Barbiturate and metabolites    200 Benzodiazepine                 256 Tricyclics and metabolites     300 Opiates and metabolites        300 Cocaine and metabolites        300 THC                             50 Performed at [facility], [place] [address] [place], [place] [address]   Glucose, capillary     Status: Abnormal   Collection Time: 10/05/21  4:38 PM  Result Value Ref Range   Glucose-Capillary 197 (H) 70 - 99 mg/dL    Comment: Glucose reference range applies only to samples taken after fasting for at least 8 hours.  Glucose, capillary     Status: Abnormal   Collection Time: 10/05/21  5:01 PM  Result Value Ref Range   Glucose-Capillary 194 (H) 70 - 99 mg/dL    Comment: Glucose reference range applies only to samples taken after fasting for at least 8 hours.  Glucose, capillary     Status: Abnormal   Collection Time: 10/05/21  5:30 PM  Result Value Ref Range   Glucose-Capillary 169 (H) 70 - 99 mg/dL    Comment: Glucose reference range applies only to samples taken after fasting for at least 8 hours.  Glucose, capillary     Status: Abnormal   Collection Time: 10/05/21  6:14 PM  Result Value Ref Range   Glucose-Capillary 224 (H) 70 - 99 mg/dL    Comment: Glucose reference range applies only to samples taken after fasting for  at least 8 hours.  Potassium     Status: None   Collection Time: 10/05/21  6:24 PM  Result Value Ref Range   Potassium 3.5 3.5 - 5.1 mmol/L    Comment: Performed at [facility], [place] [address], [place], [place] [address]  Lactic acid, plasma     Status: Abnormal   Collection Time: 10/05/21  6:24 PM  Result Value Ref Range   Lactic Acid, Venous 2.7 (HH) 0.5 - 1.9 mmol/L    Comment: CRITICAL VALUE NOTED.  VALUE IS CONSISTENT WITH PREVIOUSLY REPORTED AND CALLED VALUE. Performed at [facility], [place] [address] [place], [place] [address]   Glucose, capillary     Status: Abnormal   Collection Time: 10/05/21  7:08 PM  Result Value Ref Range   Glucose-Capillary 244 (H) 70 - 99 mg/dL    Comment: Glucose reference range applies only to samples taken after fasting for at least 8 hours.  Glucose, capillary     Status:  Abnormal   Collection Time: 10/05/21  7:34 PM  Result Value Ref Range   Glucose-Capillary 260 (H) 70 - 99 mg/dL    Comment: Glucose reference range applies only to samples taken after fasting for at least 8 hours.  Glucose, capillary     Status: Abnormal   Collection Time: 10/05/21  8:00 PM  Result Value Ref Range   Glucose-Capillary 248 (H) 70 - 99 mg/dL    Comment: Glucose reference range applies only to samples taken after fasting for at least 8 hours.  Glucose, capillary     Status: Abnormal   Collection Time: 10/05/21  8:31 PM  Result Value Ref Range   Glucose-Capillary 249 (H) 70 - 99 mg/dL    Comment: Glucose reference range applies only to samples taken after fasting for at least 8 hours.  Potassium     Status: Abnormal   Collection Time: 10/05/21  8:35 PM  Result Value Ref Range   Potassium 3.4 (L) 3.5 - 5.1 mmol/L    Comment: Performed at [facility], [place] [address], [place], [place] [address]  Glucose, capillary     Status: Abnormal   Collection Time: 10/05/21  9:06 PM  Result Value Ref Range   Glucose-Capillary 279 (H) 70 - 99 mg/dL    Comment: Glucose reference range applies only to samples taken after fasting for at least 8 hours.  Glucose, capillary     Status: Abnormal   Collection Time: 10/05/21  9:30 PM  Result Value Ref Range   Glucose-Capillary 248 (H) 70 - 99 mg/dL    Comment: Glucose reference range applies only to samples taken after fasting for at least 8 hours.  Glucose, capillary     Status: Abnormal   Collection Time: 10/05/21 10:00 PM  Result Value Ref Range   Glucose-Capillary 258 (H) 70 - 99 mg/dL    Comment: Glucose reference range applies only to samples taken after fasting for at least 8 hours.  Comprehensive metabolic panel     Status: Abnormal   Collection Time: 10/05/21 10:24 PM  Result Value Ref Range   Sodium 128 (L) 135 - 145 mmol/L   Potassium 3.2 (L) 3.5 - 5.1 mmol/L   Chloride 99 98 - 111 mmol/L   CO2 22 22 - 32 mmol/L    Glucose, Bld 369 (H) 70 - 99 mg/dL    Comment: Glucose reference range applies only to samples taken after fasting for at least 8 hours.  BUN <5 (L) 6 - 20 mg/dL   Creatinine, Ser 0.66 0.44 - 1.00 mg/dL   Calcium 6.9 (L) 8.9 - 10.3 mg/dL   Total Protein 5.3 (L) 6.5 - 8.1 g/dL   Albumin 3.0 (L) 3.5 - 5.0 g/dL   AST 17 15 - 41 U/L   ALT 14 0 - 44 U/L   Alkaline Phosphatase 43 38 - 126 U/L   Total Bilirubin 0.5 0.3 - 1.2 mg/dL   GFR, Estimated >60 >60 mL/min    Comment: (NOTE) Calculated using the CKD-EPI Creatinine Equation (2021)    Anion gap 7 5 - 15    Comment: Performed at [facility], [place] [address], [place], [place] [address]  Phosphorus     Status: Abnormal   Collection Time: 10/05/21 10:24 PM  Result Value Ref Range   Phosphorus 1.7 (L) 2.5 - 4.6 mg/dL    Comment: Performed at [facility] [address] [place], [place] [address]  Lactic acid, plasma     Status: Abnormal   Collection Time: 10/05/21 10:24 PM  Result Value Ref Range   Lactic Acid, Venous 2.4 (HH) 0.5 - 1.9 mmol/L    Comment: CRITICAL VALUE NOTED.  VALUE IS CONSISTENT WITH PREVIOUSLY REPORTED AND CALLED VALUE. Performed at [facility], [place] [address], [place], [place] [address]   Glucose, capillary     Status: Abnormal   Collection Time: 10/05/21 10:30 PM  Result Value Ref Range   Glucose-Capillary 269 (H) 70 - 99 mg/dL    Comment: Glucose reference range applies only to samples taken after fasting for at least 8 hours.  Glucose, capillary     Status: Abnormal   Collection Time: 10/05/21 11:03 PM  Result Value Ref Range   Glucose-Capillary 269 (H) 70 - 99 mg/dL    Comment: Glucose reference range applies only to samples taken after fasting for at least 8 hours.  Glucose, capillary     Status: Abnormal   Collection Time: 10/05/21 11:31 PM  Result Value Ref Range   Glucose-Capillary 265 (H) 70 - 99 mg/dL    Comment: Glucose reference range applies only to samples taken after fasting  for at least 8 hours.  Glucose, capillary     Status: Abnormal   Collection Time: 10/06/21 12:04 AM  Result Value Ref Range   Glucose-Capillary 265 (H) 70 - 99 mg/dL    Comment: Glucose reference range applies only to samples taken after fasting for at least 8 hours.  Potassium     Status: Abnormal   Collection Time: 10/06/21 12:08 AM  Result Value Ref Range   Potassium 2.9 (L) 3.5 - 5.1 mmol/L    Comment: Performed at [facility] [address] [place], [place] [address]  Glucose, capillary     Status: Abnormal   Collection Time: 10/06/21 12:29 AM  Result Value Ref Range   Glucose-Capillary 215 (H) 70 - 99 mg/dL    Comment: Glucose reference range applies only to samples taken after fasting for at least 8 hours.  Glucose, capillary     Status: Abnormal   Collection Time: 10/06/21  1:02 AM  Result Value Ref Range   Glucose-Capillary 230 (H) 70 - 99 mg/dL    Comment: Glucose reference range applies only to samples taken after fasting for at least 8 hours.  Glucose, capillary     Status: Abnormal   Collection Time: 10/06/21  1:31 AM  Result Value Ref Range   Glucose-Capillary  226 (H) 70 - 99 mg/dL    Comment: Glucose reference range applies only to samples taken after fasting for at least 8 hours.  Glucose, capillary     Status: Abnormal   Collection Time: 10/06/21  2:02 AM  Result Value Ref Range   Glucose-Capillary 218 (H) 70 - 99 mg/dL    Comment: Glucose reference range applies only to samples taken after fasting for at least 8 hours.  Potassium     Status: Abnormal   Collection Time: 10/06/21  2:16 AM  Result Value Ref Range   Potassium 3.2 (L) 3.5 - 5.1 mmol/L    Comment: Performed at [facility] [address], [place], [place] [address]  Glucose, capillary     Status: Abnormal   Collection Time: 10/06/21  2:30 AM  Result Value Ref Range   Glucose-Capillary 207 (H) 70 - 99 mg/dL    Comment: Glucose reference range applies only to samples taken after  fasting for at least 8 hours.  Glucose, capillary     Status: Abnormal   Collection Time: 10/06/21  3:03 AM  Result Value Ref Range   Glucose-Capillary 199 (H) 70 - 99 mg/dL    Comment: Glucose reference range applies only to samples taken after fasting for at least 8 hours.  Glucose, capillary     Status: Abnormal   Collection Time: 10/06/21  3:31 AM  Result Value Ref Range   Glucose-Capillary 175 (H) 70 - 99 mg/dL    Comment: Glucose reference range applies only to samples taken after fasting for at least 8 hours.  CBC     Status: Abnormal   Collection Time: 10/06/21  4:00 AM  Result Value Ref Range   WBC 23.7 (H) 4.0 - 10.5 K/uL   RBC 3.65 (L) 3.87 - 5.11 MIL/uL   Hemoglobin 11.4 (L) 12.0 - 15.0 g/dL   HCT 31.1 (L) 36.0 - 46.0 %   MCV 85.2 80.0 - 100.0 fL   MCH 31.2 26.0 - 34.0 pg   MCHC 36.7 (H) 30.0 - 36.0 g/dL   RDW 11.9 11.5 - 15.5 %   Platelets 247 150 - 400 K/uL   nRBC 0.0 0.0 - 0.2 %    Comment: Performed at [facility], [place] [address], [place], [place] [address]  Magnesium     Status: Abnormal   Collection Time: 10/06/21  4:00 AM  Result Value Ref Range   Magnesium 1.3 (L) 1.7 - 2.4 mg/dL    Comment: Performed at [facility] [address] [place], [place] [address]  Potassium     Status: Abnormal   Collection Time: 10/06/21  4:00 AM  Result Value Ref Range   Potassium 2.9 (L) 3.5 - 5.1 mmol/L    Comment: Performed at [facility] [address], [place], [place] [address]  Glucose, capillary     Status: Abnormal   Collection Time: 10/06/21  4:03 AM  Result Value Ref Range   Glucose-Capillary 179 (H) 70 - 99 mg/dL    Comment: Glucose reference range applies only to samples taken after fasting for at least 8 hours.  Glucose, capillary     Status: Abnormal   Collection Time: 10/06/21  4:31 AM  Result Value Ref Range   Glucose-Capillary 185 (H) 70 - 99 mg/dL    Comment: Glucose reference range applies only to samples taken after fasting for  at least 8 hours.  I-STAT 7, (LYTES, BLD GAS, ICA, H+H)  Status: Abnormal   Collection Time: 10/06/21  4:57 AM  Result Value Ref Range   pH, Arterial 7.343 (L) 7.35 - 7.45   pCO2 arterial 32.6 32 - 48 mmHg   pO2, Arterial 68 (L) 83 - 108 mmHg   Bicarbonate 17.8 (L) 20.0 - 28.0 mmol/L   TCO2 19 (L) 22 - 32 mmol/L   O2 Saturation 93 %   Acid-base deficit 7.0 (H) 0.0 - 2.0 mmol/L   Sodium 125 (L) 135 - 145 mmol/L   Potassium 2.9 (L) 3.5 - 5.1 mmol/L   Calcium, Ion 1.01 (L) 1.15 - 1.40 mmol/L   HCT 34.0 (L) 36.0 - 46.0 %   Hemoglobin 11.6 (L) 12.0 - 15.0 g/dL   Patient temperature 97.7 F    Collection site RADIAL, ALLEN'S TEST ACCEPTABLE    Drawn by RT    Sample type ARTERIAL   Glucose, capillary     Status: Abnormal   Collection Time: 10/06/21  5:02 AM  Result Value Ref Range   Glucose-Capillary 168 (H) 70 - 99 mg/dL    Comment: Glucose reference range applies only to samples taken after fasting for at least 8 hours.  Glucose, capillary     Status: Abnormal   Collection Time: 10/06/21  5:25 AM  Result Value Ref Range   Glucose-Capillary 174 (H) 70 - 99 mg/dL    Comment: Glucose reference range applies only to samples taken after fasting for at least 8 hours.  Glucose, capillary     Status: Abnormal   Collection Time: 10/06/21  5:57 AM  Result Value Ref Range   Glucose-Capillary 169 (H) 70 - 99 mg/dL    Comment: Glucose reference range applies only to samples taken after fasting for at least 8 hours.  Comprehensive metabolic panel     Status: Abnormal   Collection Time: 10/06/21  5:58 AM  Result Value Ref Range   Sodium 119 (LL) 135 - 145 mmol/L    Comment: CRITICAL RESULT CALLED TO, READ BACK BY AND VERIFIED WITH: ERCOLANO H,RN 10/06/21 0642 WAYK    Potassium 2.7 (LL) 3.5 - 5.1 mmol/L    Comment: CRITICAL RESULT CALLED TO, READ BACK BY AND VERIFIED WITH: ERCOLANO H,RN 10/06/21 0642 WAYK    Chloride 91 (L) 98 - 111 mmol/L   CO2 21 (L) 22 - 32 mmol/L   Glucose, Bld 269  (H) 70 - 99 mg/dL    Comment: Glucose reference range applies only to samples taken after fasting for at least 8 hours.   BUN <5 (L) 6 - 20 mg/dL   Creatinine, Ser 0.56 0.44 - 1.00 mg/dL   Calcium 6.4 (LL) 8.9 - 10.3 mg/dL    Comment: CRITICAL RESULT CALLED TO, READ BACK BY AND VERIFIED WITH: ERCOLANO [facility] 10/06/21 0642 WAYK    Total Protein 5.2 (L) 6.5 - 8.1 g/dL   Albumin 3.0 (L) 3.5 - 5.0 g/dL   AST 16 15 - 41 U/L   ALT 13 0 - 44 U/L   Alkaline Phosphatase 41 38 - 126 U/L   Total Bilirubin 0.5 0.3 - 1.2 mg/dL   GFR, Estimated >60 >60 mL/min    Comment: (NOTE) Calculated using the CKD-EPI Creatinine Equation (2021)    Anion gap 7 5 - 15    Comment: Performed at [facility], [place] [address], [place], [place] [address]  Phosphorus     Status: Abnormal   Collection Time: 10/06/21  5:58 AM  Result Value Ref Range   Phosphorus 1.6 (  L) 2.5 - 4.6 mg/dL    Comment: Performed at [facility], [place] [address] [place], [place] [address]  Glucose, capillary     Status: Abnormal   Collection Time: 10/06/21  6:31 AM  Result Value Ref Range   Glucose-Capillary 191 (H) 70 - 99 mg/dL    Comment: Glucose reference range applies only to samples taken after fasting for at least 8 hours.  Glucose, capillary     Status: Abnormal   Collection Time: 10/06/21  6:59 AM  Result Value Ref Range   Glucose-Capillary 198 (H) 70 - 99 mg/dL    Comment: Glucose reference range applies only to samples taken after fasting for at least 8 hours.  Glucose, capillary     Status: Abnormal   Collection Time: 10/06/21  7:33 AM  Result Value Ref Range   Glucose-Capillary 150 (H) 70 - 99 mg/dL    Comment: Glucose reference range applies only to samples taken after fasting for at least 8 hours.  Glucose, capillary     Status: Abnormal   Collection Time: 10/06/21  8:02 AM  Result Value Ref Range   Glucose-Capillary 197 (H) 70 - 99 mg/dL    Comment: Glucose reference range applies only to samples taken  after fasting for at least 8 hours.  Glucose, capillary     Status: Abnormal   Collection Time: 10/06/21  8:32 AM  Result Value Ref Range   Glucose-Capillary 204 (H) 70 - 99 mg/dL    Comment: Glucose reference range applies only to samples taken after fasting for at least 8 hours.  Potassium     Status: Abnormal   Collection Time: 10/06/21  8:49 AM  Result Value Ref Range   Potassium 2.6 (LL) 3.5 - 5.1 mmol/L    Comment: CRITICAL RESULT CALLED TO, READ BACK BY AND VERIFIED WITH: N.[facility] 10/06/2021 AT 1007 A.HUGHES Performed at [facility], [place] [address], [place], [place] [address]   Cortisol     Status: None   Collection Time: 10/06/21  8:49 AM  Result Value Ref Range   Cortisol, Plasma 28.5 ug/dL    Comment: (NOTE) AM    6.7 - 22.6 ug/dL PM   <10.0       ug/dL Performed at [facility] [address] [place], [place] [address]   Glucose, capillary     Status: Abnormal   Collection Time: 10/06/21  9:12 AM  Result Value Ref Range   Glucose-Capillary 142 (H) 70 - 99 mg/dL    Comment: Glucose reference range applies only to samples taken after fasting for at least 8 hours.  Glucose, capillary     Status: Abnormal   Collection Time: 10/06/21  9:35 AM  Result Value Ref Range   Glucose-Capillary 318 (H) 70 - 99 mg/dL    Comment: Glucose reference range applies only to samples taken after fasting for at least 8 hours.  Glucose, capillary     Status: Abnormal   Collection Time: 10/06/21  9:56 AM  Result Value Ref Range   Glucose-Capillary 214 (H) 70 - 99 mg/dL    Comment: Glucose reference range applies only to samples taken after fasting for at least 8 hours.  Potassium     Status: Abnormal   Collection Time: 10/06/21 10:07 AM  Result Value Ref Range   Potassium 2.6 (LL) 3.5 - 5.1 mmol/L    Comment: CRITICAL RESULT CALLED TO, READ BACK BY AND VERIFIED WITH: N.MAERLENDER,RM 10/06/2021 AT 1107 A.HUGHES Performed  at [facility], [place] [address],  [place], [place] [address]   Glucose, capillary     Status: Abnormal   Collection Time: 10/06/21 10:27 AM  Result Value Ref Range   Glucose-Capillary 196 (H) 70 - 99 mg/dL    Comment: Glucose reference range applies only to samples taken after fasting for at least 8 hours.  Glucose, capillary     Status: Abnormal   Collection Time: 10/06/21 10:55 AM  Result Value Ref Range   Glucose-Capillary 190 (H) 70 - 99 mg/dL    Comment: Glucose reference range applies only to samples taken after fasting for at least 8 hours.    Current Facility-Administered Medications  Medication Dose Route Frequency Provider Last Rate Last Admin   0.9 %  sodium chloride infusion   Intravenous Continuous Carmin Muskrat, MD 125 mL/hr at 10/06/21 1000 Infusion Verify at 10/06/21 1000   Chlorhexidine Gluconate Cloth 2 % PADS 6 each  6 each Topical Daily Elsie Lincoln, MD       dextrose 20 % infusion   Intravenous Continuous Chesley Mires, MD 75 mL/hr at 10/06/21 1031 New Bag at 10/06/21 1031   dextrose 50 % solution 35.85 g  0.5 g/kg Intravenous Once PRN Wynetta Fines, MD       dextrose 50 % solution 50 mL  1 ampule Intravenous PRN Freddi Starr, MD   50 mL at 10/06/21 0929   docusate sodium (COLACE) capsule 100 mg  100 mg Oral BID PRN Minor, Grace Bushy, NP       DOPamine (INTROPIN) 800 mg in dextrose 5 % 250 mL (3.2 mg/mL) infusion  0-20 mcg/kg/min Intravenous Titrated Freda Jackson B, MD 16.11 mL/hr at 10/06/21 1000 10 mcg/kg/min at 10/06/21 1000   enoxaparin (LOVENOX) injection 40 mg  40 mg Subcutaneous Q24H Chesley Mires, MD   40 mg at 10/06/21 1008   EPINEPHrine (ADRENALIN) 5 mg in NS 250 mL (0.02 mg/mL) premix infusion  0.5-20 mcg/min Intravenous Titrated Freddi Starr, MD   Stopped at 10/05/21 2339   insulin HIGH DOSE (4 units/mL) infusion for beta blocker/calcium channel blocker overdose  4 Units/kg/hr Intravenous Titrated Elsie Lincoln, MD 64.4 mL/hr at 10/06/21 1013 3 Units/kg/hr at 10/06/21 1013    pantoprazole (PROTONIX) injection 40 mg  40 mg Intravenous QHS Minor, Grace Bushy, NP   40 mg at 10/05/21 2246   polyethylene glycol (MIRALAX / GLYCOLAX) packet 17 g  17 g Oral Daily PRN Minor, Grace Bushy, NP       potassium chloride 10 mEq in 50 mL *CENTRAL LINE* IVPB  10 mEq Intravenous Q1 Hr x 4 Sood, Vineet, MD 50 mL/hr at 10/06/21 1029 10 mEq at 10/06/21 1029   potassium PHOSPHATE 30 mmol in dextrose 5 % 500 mL infusion  30 mmol Intravenous Once Elsie Lincoln, MD 85 mL/hr at 10/06/21 1000 Infusion Verify at 10/06/21 1000    Musculoskeletal: Strength & Muscle Tone: within normal limits Gait & Station: normal Patient leans: N/A  Psychiatric Specialty Exam: Physical Exam Vitals and nursing note reviewed.  Constitutional:      Comments: Sedated  Cardiovascular:     Rate and Rhythm: Bradycardia present.  Pulmonary:     Effort: Pulmonary effort is normal. No respiratory distress.  Psychiatric:        Attention and Perception: Attention and perception normal.        Mood and Affect: Mood is anxious and depressed.        Speech: Speech normal.  Behavior: Behavior is cooperative.        Thought Content: Thought content includes suicidal ideation. Thought content includes suicidal plan.        Cognition and Memory: Cognition and memory normal.        Judgment: Judgment is impulsive.    Review of Systems  Psychiatric/Behavioral:  Positive for decreased concentration, depression and dysphoric mood. Negative for agitation, hallucinations (denies current, was having CAH prior to hospitalization) and suicidal ideas.   All other systems reviewed and are negative.  Blood pressure 135/73, pulse (!) 44, temperature 97.9 F (36.6 C), temperature source Axillary, resp. rate 20, height '5\' 4"'$  (1.626 m), weight 85.9 kg, SpO2 99 %.Body mass index is 32.51 kg/m.  General Appearance: Disheveled and sedated with multiple IV lines  Eye Contact:   Poor, only briefly opens eyes  Speech:  Slow  Volume:   Decreased  Mood:  Anxious and Depressed  Affect:  Congruent  Thought Process:  Coherent and Descriptions of Associations: Intact  Orientation:  Other:  too sedated to respond fully, acknowledges yes that she knows the date by nodding her head  Thought Content:  Hallucinations: Auditory  Suicidal Thoughts:  No, denies today  Homicidal Thoughts:  No  Memory:  Fair  Judgement:  Poor  Insight:  Shallow  Psychomotor Activity:  Decreased  Concentration:  Concentration: Poor and Attention Span: Poor  Recall:  [place] of Knowledge:  NA  Language:  Fair  Akathisia:  No  Handed:  Right  AIMS (if indicated):     Assets:  Desire for Improvement Housing Resilience Social Support  ADL's:  Impaired  Cognition:  Impaired,  Moderate  Sleep:   sedated while in ICU     Physical Exam: Physical Exam Vitals and nursing note reviewed.  Constitutional:      Comments: Sedated  Cardiovascular:     Rate and Rhythm: Bradycardia present.  Pulmonary:     Effort: Pulmonary effort is normal. No respiratory distress.  Psychiatric:        Attention and Perception: Attention and perception normal.        Mood and Affect: Mood is anxious and depressed.        Speech: Speech normal.        Behavior: Behavior is cooperative.        Thought Content: Thought content includes suicidal ideation. Thought content includes suicidal plan.        Cognition and Memory: Cognition and memory normal.        Judgment: Judgment is impulsive.   Review of Systems  Psychiatric/Behavioral:  Positive for decreased concentration, depression and dysphoric mood. Negative for agitation, hallucinations (denies current, was having CAH prior to hospitalization) and suicidal ideas.   All other systems reviewed and are negative. Blood pressure 135/73, pulse (!) 44, temperature 97.9 F (36.6 C), temperature source Axillary, resp. rate 20, height '5\' 4"'$  (1.626 m), weight 85.9 kg, SpO2 99 %. Body mass index is 32.51  kg/m.  Treatment Plan Summary: Daily contact with patient to assess and evaluate symptoms and progress in treatment, Medication management, and Plan : Major depressive disorder, recurrent, severe without psychosis.  Continue to hold medications at this time.  Recommend low-dose Haldol 2 mg every 8 hours as needed for hallucinations or agitation.  This can be administered IV. Continue to monitor QTc prolongation should Haldol be started.  Psychiatry will continue to follow for medication recommendations as patient stabilizes.   Disposition: Recommend psychiatric Inpatient admission when medically  cleared.  Lavella Hammock, MD 10/06/2021 11:13 AM

## 2021-10-06 NOTE — Progress Notes (Addendum)
? ?NAME:  Leslie Elliott, MRN:  [id number], DOB:  Mar 06, 1976, LOS: 1 ?ADMISSION DATE:  10/05/2021, CONSULTATION DATE: 10/05/2021 ?REFERRING MD: Dr. Jerrol Banana, CHIEF COMPLAINT: Medication overdose ? ?History of Present Illness:  ?46 yo female developed recurrent suicidal ideation.  She overdosed on inderal, abilify, and ativan.  She had HR in 30's in ER and started on pressors.  Also started on glucagon, and insulin gtt with dextrose.  PCCM asked to admit to ICU. ? ?Pertinent  Medical History  ?Anemia, Anxiety, Depression, Suicidal ideation, Ovarian cyst ? ?Significant Hospital Events: ?Including procedures, antibiotic start and stop dates in addition to other pertinent events   ?Started on Levophed glucagon dextrose and insulin ? ?Interim History / Subjective:  ?Admitted with overdose beta-blocker antipsychotic benzodiazepines ? ?Objective   ?Blood pressure 105/60, pulse (!) 44, temperature 97.9 ?F (36.6 ?C), temperature source Axillary, resp. rate (!) 24, height '5\' 4"'$  (1.626 m), weight 85.9 kg, SpO2 98 %. ?   ?   ? ?Intake/Output Summary (Last 24 hours) at 10/06/2021 0811 ?Last data filed at 10/06/2021 3086 ?Gross per 24 hour  ?Intake 14466.26 ml  ?Output 5850 ml  ?Net 8616.26 ml  ? ?Filed Weights  ? 10/05/21 1017 10/06/21 0500  ?Weight: 71.7 kg 85.9 kg  ? ? ?Examination: ? ?General - somnolent ?Eyes - pupils reactive ?ENT - NG tube in place ?Cardiac - regular, bradycardic ?Chest - equal breath sounds b/l, no wheezing or rales ?Abdomen - soft, decreased bowel sounds ?Extremities - 1+ edema ?Skin - no rashes ?Neuro - follows simple commands, moves all extremities ? ?Resolved Hospital Problem list   ? ? ?Assessment & Plan:  ? ?Cardiogenic shock with symptomatic bradycardia 2nd to intention overdose of inderal. ?- continue glucagon, insulin/dextrose, levophed ?- continue IV fluids ?- goal HR > 60, SBP > 90 ? ?Suicide attempt with intentional overdose. ?Hx of major depression s/p previous hospitalization and ECT. ?- will need  further assessment by psychiatry when medically stable ?- sitter at bedside ? ?Hyponatremia from dextrose infusion. ?- NS IV fluid at 125 ml/hr ?- f/u BMET ?- TSH 0.577 from 10/05/21 ?- check cortisol, urine studies ? ?Hypokalemia., hypophosphatemia, Hypomagnesemia. ?- f/u BMET and replace as needed ? ?Leukocytosis. ?- f/u CBC ? ?Best Practice (right click and "Reselect all SmartList Selections" daily)  ? ?Diet/type: NPO ?DVT prophylaxis: LMWH ?GI prophylaxis: PPI ?Lines: Central line ?Foley:  N/A ?Code Status:  full code ?Last date of multidisciplinary goals of care discussion: updated pt's husband at bedside ? ?Labs   ? ?CMP Latest Ref Rng & Units 10/06/2021 10/06/2021 10/06/2021  ?Glucose 70 - 99 mg/dL 269(H) - -  ?BUN 6 - 20 mg/dL <5(L) - -  ?Creatinine 0.44 - 1.00 mg/dL 0.56 - -  ?Sodium 135 - 145 mmol/L 119(LL) 125(L) -  ?Potassium 3.5 - 5.1 mmol/L 2.7(LL) 2.9(L) 2.9(L)  ?Chloride 98 - 111 mmol/L 91(L) - -  ?CO2 22 - 32 mmol/L 21(L) - -  ?Calcium 8.9 - 10.3 mg/dL 6.4(LL) - -  ?Total Protein 6.5 - 8.1 g/dL 5.2(L) - -  ?Total Bilirubin 0.3 - 1.2 mg/dL 0.5 - -  ?Alkaline Phos 38 - 126 U/L 41 - -  ?AST 15 - 41 U/L 16 - -  ?ALT 0 - 44 U/L 13 - -  ? ? ?CBC Latest Ref Rng & Units 10/06/2021 10/06/2021 10/05/2021  ?WBC 4.0 - 10.5 K/uL - 23.7(H) 24.1(H)  ?Hemoglobin 12.0 - 15.0 g/dL 11.6(L) 11.4(L) 11.9(L)  ?Hematocrit 36.0 - 46.0 % 34.0(L) 31.1(L)  36.0  ?Platelets 150 - 400 K/uL - 247 348  ? ? ?ABG ?   ?Component Value Date/Time  ? PHART 7.343 (L) 10/06/2021 0457  ? PCO2ART 32.6 10/06/2021 0457  ? PO2ART 68 (L) 10/06/2021 0457  ? HCO3 17.8 (L) 10/06/2021 0457  ? TCO2 19 (L) 10/06/2021 0457  ? ACIDBASEDEF 7.0 (H) 10/06/2021 0457  ? O2SAT 93 10/06/2021 0457  ? ? ?CBG (last 3)  ?Recent Labs  ?  10/06/21 ?0659 10/06/21 ?0733 10/06/21 ?0802  ?GLUCAP 198* 150* 197*  ? ?Critical care time: 38 minutes  ?Chesley Mires, MD ?[place] ?Pager - [phone] ?10/06/2021, 8:31 AM ? ? ? ? ? ?

## 2021-10-07 DIAGNOSIS — R001 Bradycardia, unspecified: Secondary | ICD-10-CM

## 2021-10-07 DIAGNOSIS — I952 Hypotension due to drugs: Secondary | ICD-10-CM | POA: Diagnosis not present

## 2021-10-07 DIAGNOSIS — F333 Major depressive disorder, recurrent, severe with psychotic symptoms: Secondary | ICD-10-CM | POA: Diagnosis not present

## 2021-10-07 DIAGNOSIS — T50902A Poisoning by unspecified drugs, medicaments and biological substances, intentional self-harm, initial encounter: Secondary | ICD-10-CM | POA: Diagnosis not present

## 2021-10-07 LAB — GLUCOSE, CAPILLARY
Glucose-Capillary: 108 mg/dL — ABNORMAL HIGH (ref 70–99)
Glucose-Capillary: 111 mg/dL — ABNORMAL HIGH (ref 70–99)
Glucose-Capillary: 123 mg/dL — ABNORMAL HIGH (ref 70–99)
Glucose-Capillary: 127 mg/dL — ABNORMAL HIGH (ref 70–99)
Glucose-Capillary: 131 mg/dL — ABNORMAL HIGH (ref 70–99)
Glucose-Capillary: 139 mg/dL — ABNORMAL HIGH (ref 70–99)
Glucose-Capillary: 141 mg/dL — ABNORMAL HIGH (ref 70–99)
Glucose-Capillary: 153 mg/dL — ABNORMAL HIGH (ref 70–99)
Glucose-Capillary: 163 mg/dL — ABNORMAL HIGH (ref 70–99)
Glucose-Capillary: 166 mg/dL — ABNORMAL HIGH (ref 70–99)
Glucose-Capillary: 200 mg/dL — ABNORMAL HIGH (ref 70–99)

## 2021-10-07 LAB — BASIC METABOLIC PANEL
Anion gap: 8 (ref 5–15)
BUN: <5 mg/dL — ABNORMAL LOW (ref 6–20)
CO2: 24 mmol/L (ref 22–32)
Calcium: 7.7 mg/dL — ABNORMAL LOW (ref 8.9–10.3)
Chloride: 106 mmol/L (ref 98–111)
Creatinine, Ser: 0.8 mg/dL (ref 0.44–1.00)
GFR, Estimated: >60 mL/min (ref >60)
Glucose, Bld: 146 mg/dL — ABNORMAL HIGH (ref 70–99)
Potassium: 3.6 mmol/L (ref 3.5–5.1)
Sodium: 138 mmol/L (ref 135–145)

## 2021-10-07 LAB — CBC
HCT: 36.5 % (ref 36.0–46.0)
Hemoglobin: 13.4 g/dL (ref 12.0–15.0)
MCH: 30.7 pg (ref 26.0–34.0)
MCHC: 36.7 g/dL — ABNORMAL HIGH (ref 30.0–36.0)
MCV: 83.7 fL (ref 80.0–100.0)
Platelets: 353 K/uL (ref 150–400)
RBC: 4.36 MIL/uL (ref 3.87–5.11)
RDW: 12.3 % (ref 11.5–15.5)
WBC: 20.9 K/uL — ABNORMAL HIGH (ref 4.0–10.5)
nRBC: 0 % (ref 0.0–0.2)

## 2021-10-07 LAB — CALCIUM, IONIZED: Calcium, Ionized, Serum: 4.4 mg/dL — ABNORMAL LOW (ref 4.5–5.6)

## 2021-10-07 LAB — MAGNESIUM: Magnesium: 1.9 mg/dL (ref 1.7–2.4)

## 2021-10-07 LAB — PHOSPHORUS: Phosphorus: 2.8 mg/dL (ref 2.5–4.6)

## 2021-10-07 MED ORDER — POTASSIUM PHOSPHATES 15 MMOLE/5ML IV SOLN
30.0000 mmol | Freq: Once | INTRAVENOUS | Status: AC
  Administered 2021-10-07: 30 mmol via INTRAVENOUS
  Filled 2021-10-07: qty 10

## 2021-10-07 MED ORDER — CALCIUM GLUCONATE-NACL 1-0.675 GM/50ML-% IV SOLN
1.0000 g | Freq: Once | INTRAVENOUS | Status: AC
  Administered 2021-10-07: 1000 mg via INTRAVENOUS
  Filled 2021-10-07: qty 50

## 2021-10-07 MED ORDER — BOOST / RESOURCE BREEZE PO LIQD CUSTOM
1.0000 | Freq: Three times a day (TID) | ORAL | Status: DC
  Administered 2021-10-09 – 2021-10-11 (×7): 1 via ORAL
  Filled 2021-10-07 (×2): qty 1

## 2021-10-07 MED ORDER — MAGNESIUM SULFATE 2 GM/50ML IV SOLN
2.0000 g | Freq: Once | INTRAVENOUS | Status: AC
  Administered 2021-10-07: 2 g via INTRAVENOUS
  Filled 2021-10-07: qty 50

## 2021-10-07 MED ORDER — ACETAMINOPHEN 325 MG PO TABS
650.0000 mg | ORAL_TABLET | Freq: Four times a day (QID) | ORAL | Status: DC | PRN
  Administered 2021-10-07 – 2021-10-10 (×3): 650 mg via ORAL
  Filled 2021-10-07 (×3): qty 2

## 2021-10-07 NOTE — Progress Notes (Signed)
? ?NAME:  Leslie Elliott, MRN:  [id number], DOB:  19-Feb-1976, LOS: 2 ?ADMISSION DATE:  10/05/2021, CONSULTATION DATE: 10/05/2021 ?REFERRING MD: Dr. Jerrol Banana, CHIEF COMPLAINT: Medication overdose ? ?History of Present Illness:  ?46 yo female developed recurrent suicidal ideation.  She overdosed on inderal, abilify, and ativan.  She had HR in 30's in ER and started on pressors.  Also started on glucagon, and insulin gtt with dextrose.  PCCM asked to admit to ICU. ? ?Pertinent  Medical History  ?Anemia, Anxiety, Depression, Suicidal ideation, Ovarian cyst ? ?Significant Hospital Events: ?Including procedures, antibiotic start and stop dates in addition to other pertinent events   ?3/10 Started on Levophed glucagon dextrose and insulin ?3/11 off insulin and dextrose ? ?Interim History / Subjective:  ?Had headache overnight.  Denies chest pain, dyspnea, nausea, abdominal pain. ? ?Objective   ?Blood pressure (!) 111/52, pulse (!) 56, temperature 99.7 ?F (37.6 ?C), temperature source Axillary, resp. rate 15, height '5\' 4"'$  (1.626 m), weight 85.9 kg, SpO2 96 %. ?   ?   ? ?Intake/Output Summary (Last 24 hours) at 10/07/2021 0745 ?Last data filed at 10/07/2021 0500 ?Gross per 24 hour  ?Intake 8961.79 ml  ?Output 16000 ml  ?Net -7038.21 ml  ? ?Filed Weights  ? 10/05/21 1017 10/06/21 0500  ?Weight: 71.7 kg 85.9 kg  ? ? ?Examination: ? ?General - alert ?Eyes - pupils reactive ?ENT - no sinus tenderness, no stridor ?Cardiac - regular rate/rhythm, no murmur ?Chest - equal breath sounds b/l, no wheezing or rales ?Abdomen - soft, non tender, + bowel sounds ?Extremities - no cyanosis, clubbing, or edema ?Skin - no rashes ?Neuro - normal strength, moves extremities, follows commands ?Psych - normal mood and behavior ? ?Resolved Hospital Problem list   ?Hyponatremia from dextrose infusion ? ?Assessment & Plan:  ? ?Cardiogenic shock with symptomatic bradycardia 2nd to intention overdose of inderal. ?- wean off pressors to keep HR > 50, SBP >  90 ?- continue NS IV fluids at 50 ml/hr ? ?Suicide attempt with intentional overdose. ?Hx of major depression s/p previous hospitalization and ECT. ?- sitter at bedside ?- psychiatry consulted ?- prn haldol IV until she can take pills ? ?Hypokalemia., hypophosphatemia, Hypomagnesemia. ?- f/u electrolytes after replacement ? ?Leukocytosis. ?- f/u CBC ? ?Best Practice (right click and "Reselect all SmartList Selections" daily)  ? ?Diet/type: advance as tolerated ?DVT prophylaxis: LMWH ?GI prophylaxis: PPI ?Lines: Central line ?Foley:  N/A ?Code Status:  full code ?Last date of multidisciplinary goals of care discussion: updated pt's husband at bedside ? ?Labs   ? ?CMP Latest Ref Rng & Units 10/07/2021 10/06/2021 10/06/2021  ?Glucose 70 - 99 mg/dL 146(H) 155(H) -  ?BUN 6 - 20 mg/dL <5(L) <5(L) -  ?Creatinine 0.44 - 1.00 mg/dL 0.80 0.67 -  ?Sodium 135 - 145 mmol/L 138 137 -  ?Potassium 3.5 - 5.1 mmol/L 3.6 3.5 3.8  ?Chloride 98 - 111 mmol/L 106 105 -  ?CO2 22 - 32 mmol/L 24 25 -  ?Calcium 8.9 - 10.3 mg/dL 7.7(L) 7.6(L) -  ?Total Protein 6.5 - 8.1 g/dL - 5.8(L) -  ?Total Bilirubin 0.3 - 1.2 mg/dL - 0.4 -  ?Alkaline Phos 38 - 126 U/L - 50 -  ?AST 15 - 41 U/L - 22 -  ?ALT 0 - 44 U/L - 19 -  ? ? ?CBC Latest Ref Rng & Units 10/07/2021 10/06/2021 10/06/2021  ?WBC 4.0 - 10.5 K/uL 20.9(H) - 23.7(H)  ?Hemoglobin 12.0 - 15.0 g/dL 13.4 11.6(L) 11.4(L)  ?  Hematocrit 36.0 - 46.0 % 36.5 34.0(L) 31.1(L)  ?Platelets 150 - 400 K/uL 353 - 247  ? ? ?ABG ?   ?Component Value Date/Time  ? PHART 7.343 (L) 10/06/2021 0457  ? PCO2ART 32.6 10/06/2021 0457  ? PO2ART 68 (L) 10/06/2021 0457  ? HCO3 17.8 (L) 10/06/2021 0457  ? TCO2 19 (L) 10/06/2021 0457  ? ACIDBASEDEF 7.0 (H) 10/06/2021 0457  ? O2SAT 93 10/06/2021 0457  ? ? ?CBG (last 3)  ?Recent Labs  ?  10/07/21 ?7867 10/07/21 ?0617 10/07/21 ?5449  ?GLUCAP 131* 108* 111*  ? ?Critical care time: 32 minutes  ?Chesley Mires, MD ?[place] ?Pager - [phone] ?10/07/2021, 7:45  AM ? ? ? ? ? ?

## 2021-10-07 NOTE — Progress Notes (Signed)
[facility] ADULT ICU REPLACEMENT PROTOCOL ? ? ?The patient does apply for the [facility] Adult ICU Electrolyte Replacment Protocol based on the criteria listed below:  ? ?1.Exclusion criteria: TCTS patients, ECMO patients, and Dialysis patients ?2. Is GFR >/= 30 ml/min? Yes.    ?Patient's GFR today is >60 ?3. Is SCr </= 2? Yes.   ?Patient's SCr is 0.80 mg/dL ?4. Did SCr increase >/= 0.5 in 24 hours? No. ?5.Pt's weight >40kg  Yes.   ?6. Abnormal electrolyte(s): Mag 1.9  ?7. Electrolytes replaced per protocol ?8.  Call MD STAT for K+ </= 2.5, Phos </= 1, or Mag </= 1 ?Physician:  Jeannene Patella ? ?Carlisle Beers 10/07/2021 5:37 AM  ?

## 2021-10-07 NOTE — Consult Note (Addendum)
Leslie Elliott Face-to-Face Psychiatry Consult   Reason for Consult: Overdose on medication Referring Physician: Freda Elliott Patient Identification: Leslie Elliott MRN:  [id number] Principal Diagnosis: Drug overdose Diagnosis:  Principal Problem:   Drug overdose Active Problems:   MDD (major depressive disorder), recurrent, severe, with psychosis ([place])  Total Time Spent in Direct Patient Care:  I personally spent 40 minutes on the unit in direct patient care. The direct patient care time included face-to-face time with the patient, reviewing the patient's chart, communicating with other professionals, and coordinating care. Greater than 50% of this time was spent in counseling or coordinating care with the patient regarding goals of hospitalization, psycho-education, and discharge planning needs.  Subjective: "I did not tell my psychiatrist about the messages I was getting."  HPI:  Leslie Elliott is a 46 y.o. female patient with a history of depression and anxiety who was admitted to the [facility] following a suicide attempt with overdose on Abilify, Ativan, and propranolol.  She was transferred to the ICU with bradycardia.  On evaluation patient is awake and alert.  Her mother and sister, Leslie Elliott and Leslie Elliott, are at her bedside.  Patient is oriented x4.  Reviewed with patient context of her admission.  She continues to deny wanting to die, but does endorse getting messages from Leslie Elliott to kill herself.  Patient admits that she did not share this information with her outpatient psychiatrist, but just had told him that she was more depressed.  Patient reports remorse for her actions.  She confirms that she had the voices and messages prior to Leslie Elliott being initiated.  At this time, patient is reluctant to restart medications.  She denies withdrawal symptoms from her usual psychotropic medications.  She is tolerating p.o., started on clear liquids.  She states that she is trying to rest/sleep best is  able while in the ICU. She denies current suicidal ideation, plan or intent.  She states she is no longer getting messages from [name] at this time.  She acknowledges that she will be able to tell nursing staff should she start to have auditory or visual hallucinations.  Nursing is at bedside and note that patient's blood pressure has not yet stabilized.  She continues to be bradycardic.   Patient's mother, Leslie Elliott (phone [phone]) is at bedside for collateral on 10/06/2021 Patient's mother states that approximately 1 week ago Leslie Elliott began listening to [place] and began perseverating that she was a bad person.  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  Mother would also find her disoriented and staring out the window for long periods of time.  She told her mother that Leslie Elliott was telling her that she was a bad person, and mother encouraged her to make an appointment with her psychiatrist, Leslie Elliott which she had on Friday, 09/28/2021.  Patient and psychiatrist decided to do a trial of [place] which she started on Monday or Tuesday of this past week.  Mother states that she has been staying with the family at their home while patient's husband is at work to ensure that patient is safe.  Mother states that on Tuesday following [name] treatment patient began getting messages from [name] telling her that she should not be here and that nobody wants her here.  On Wednesday she asked her mother if she was the antichrist, and on Thursday she commented that she was "tired of this fight".  Mother is uncertain if patient shared her hallucinations with her psychiatrist, noting that patient was afraid she would be readmitted to the [facility] for a psychiatric admission. Mother states that patient is not currently working with a therapist, but has been encouraged to do so by her psychiatrist as well as her  daughter psychiatrist.  Past Psychiatric History: depression, anxiety, multiple suicide attempts and ECT treatment Patient was last seen by psychiatry 03/05/2021.  At that time she reported depression and anxiety with threats of suicide and was admitted to inpatient psychiatry.  She had been experiencing high level of depression with suicidal ideations, multiple attempts over the weekend with her husband intervening.   Prior episode to this level was in 2007 which required hospitalization and ECT to stabilize.    Risk to Self:  yes Risk to Others:  none Prior Inpatient Therapy:  Leslie Elliott 2007 with ECT, 2022- discharged on Abilify 5 mg and Wellbutrin XL 300 mg Prior Outpatient Therapy:  Dr Latricia Elliott  Past Medical History:  Past Medical History:  Diagnosis Date   Anemia    Anxiety    Depression    Ovarian cyst     Past Surgical History:  Procedure Laterality Date   ECT TREATMENTS     ROBOTIC ASSISTED BILATERAL SALPINGO OOPHERECTOMY Left 10/30/2020   Procedure: XI ROBOTIC ASSISTED SALPINGO OOPHORECTOMY;  Surgeon: Leslie Kindler, MD;  Location: [facility] ORS;  Service: Gynecology;  Laterality: Left;   ROBOTIC ASSISTED LAPAROSCOPIC OVARIAN CYSTECTOMY Bilateral 10/30/2020   Procedure: XI ROBOTIC ASSISTED LAPAROSCOPIC OVARIAN CYSTECTOMY;  Surgeon: Leslie Kindler, MD;  Location: [facility] ORS;  Service: Gynecology;  Laterality: Bilateral;   TUBAL LIGATION  2002   XI ROBOTIC ASSISTED SALPINGECTOMY Right 10/30/2020   Procedure: XI ROBOTIC ASSISTED SALPINGECTOMY;  Surgeon: Leslie Kindler, MD;  Location: [facility] ORS;  Service: Gynecology;  Laterality: Right;   Family History:  Family History  Problem Relation Age of Onset   Breast cancer Neg Hx    Family Psychiatric  History: daughter with depression and anxiety   Social History:  Social History   Substance and Sexual Activity  Alcohol Use Yes   Comment: occassional     Social History   Substance and Sexual Activity  Drug Use Never    Social History    Socioeconomic History   Marital status: Married    Spouse name: Leslie Elliott   Number of children: Not on file   Years of education: Not on file   Highest education level: Not on file  Occupational History   Not on file  Tobacco Use   Smoking status: Never   Smokeless tobacco: Never  Vaping Use   Vaping Use: Never used  Substance and Sexual Activity   Alcohol use: Yes    Comment: occassional   Drug use: Never   Sexual activity: Not on file  Other Topics Concern   Not on file  Social History Narrative   Not on file   Social Determinants of Health   Financial Resource Strain: Not on file  Food Insecurity: Not on file  Transportation Needs: Not on file  Physical Activity: Not on file  Stress: Not on file  Social Connections: Not on file   Additional Social History: Lives with husband and her children    Allergies:   Allergies  Allergen Reactions   Zofran [Ondansetron Hcl] Rash    Labs:  Results for orders placed or performed during the [facility] encounter of 10/05/21 (from the past 48 hour(s))  CBG monitoring, ED  Status: Abnormal   Collection Time: 10/05/21 11:48 AM  Result Value Ref Range   Glucose-Capillary 246 (H) 70 - 99 mg/dL    Comment: Glucose reference range applies only to samples taken after fasting for at least 8 hours.  Lactic acid, plasma     Status: Abnormal   Collection Time: 10/05/21 12:18 PM  Result Value Ref Range   Lactic Acid, Venous 3.6 (HH) 0.5 - 1.9 mmol/L    Comment: CRITICAL RESULT CALLED TO, READ BACK BY AND VERIFIED WITH: MAERLENDER,N RN @ 1325 10/05/21 LEONARD,A Performed at [facility], [place] [address] [place], [place] [address]   Magnesium     Status: Abnormal   Collection Time: 10/05/21 12:18 PM  Result Value Ref Range   Magnesium 1.4 (L) 1.7 - 2.4 mg/dL    Comment: Performed at [facility] [address] [place], [place] [address]  Phosphorus     Status: Abnormal   Collection Time: 10/05/21 12:18 PM  Result  Value Ref Range   Phosphorus 1.6 (L) 2.5 - 4.6 mg/dL    Comment: Performed at [facility] [address], [place], [place] [address]  Cortisol     Status: None   Collection Time: 10/05/21 12:18 PM  Result Value Ref Range   Cortisol, Plasma 48.4 ug/dL    Comment: (NOTE) AM    6.7 - 22.6 ug/dL PM   <10.0       ug/dL Performed at [facility] [address] [place], [place] [address]   Protime-INR     Status: Abnormal   Collection Time: 10/05/21 12:18 PM  Result Value Ref Range   Prothrombin Time 15.4 (H) 11.4 - 15.2 seconds   INR 1.2 0.8 - 1.2    Comment: (NOTE) INR goal varies based on device and disease states. Performed at [facility], [place] [address] [place], [place] [address]   APTT     Status: None   Collection Time: 10/05/21 12:18 PM  Result Value Ref Range   aPTT 27 24 - 36 seconds    Comment: Performed at [facility] [address] [place], [place] [address]  CBG monitoring, ED     Status: Abnormal   Collection Time: 10/05/21 12:26 PM  Result Value Ref Range   Glucose-Capillary 173 (H) 70 - 99 mg/dL    Comment: Glucose reference range applies only to samples taken after fasting for at least 8 hours.  I-Stat arterial blood gas, ED     Status: Abnormal   Collection Time: 10/05/21 12:33 PM  Result Value Ref Range   pH, Arterial 7.321 (L) 7.35 - 7.45   pCO2 arterial 39.6 32 - 48 mmHg   pO2, Arterial 145 (H) 83 - 108 mmHg   Bicarbonate 20.4 20.0 - 28.0 mmol/L   TCO2 22 22 - 32 mmol/L   O2 Saturation 99 %   Acid-base deficit 5.0 (H) 0.0 - 2.0 mmol/L   Sodium 141 135 - 145 mmol/L   Potassium 3.4 (L) 3.5 - 5.1 mmol/L   Calcium, Ion 1.16 1.15 - 1.40 mmol/L   HCT 29.0 (L) 36.0 - 46.0 %   Hemoglobin 9.9 (L) 12.0 - 15.0 g/dL   Sample type ARTERIAL   MRSA Next Gen by PCR, Nasal     Status: None   Collection Time: 10/05/21 12:45 PM   Specimen: Nasal Mucosa; Nasal Swab  Result Value Ref Range   MRSA by PCR Next Gen NOT  DETECTED NOT DETECTED     Comment: (NOTE) The GeneXpert MRSA Assay (FDA approved for NASAL specimens only), is one component of a comprehensive MRSA colonization surveillance program. It is not intended to diagnose MRSA infection nor to guide or monitor treatment for MRSA infections. Test performance is not FDA approved in patients less than 43 years old. Performed at [facility], [place] [address] [place], [place] [address]   Glucose, capillary     Status: Abnormal   Collection Time: 10/05/21  1:04 PM  Result Value Ref Range   Glucose-Capillary 129 (H) 70 - 99 mg/dL    Comment: Glucose reference range applies only to samples taken after fasting for at least 8 hours.  Potassium     Status: None   Collection Time: 10/05/21  1:06 PM  Result Value Ref Range   Potassium 3.5 3.5 - 5.1 mmol/L    Comment: Performed at [facility], [place] [address] [place], [place] [address]  HIV Antibody (routine testing w rflx)     Status: None   Collection Time: 10/05/21  1:06 PM  Result Value Ref Range   HIV Screen 4th Generation wRfx Non Reactive Non Reactive    Comment: Performed at [facility], [place] [address] [place], [place] [address]  CBC     Status: Abnormal   Collection Time: 10/05/21  1:06 PM  Result Value Ref Range   WBC 24.1 (H) 4.0 - 10.5 K/uL   RBC 3.93 3.87 - 5.11 MIL/uL   Hemoglobin 11.9 (L) 12.0 - 15.0 g/dL   HCT 36.0 36.0 - 46.0 %   MCV 91.6 80.0 - 100.0 fL   MCH 30.3 26.0 - 34.0 pg   MCHC 33.1 30.0 - 36.0 g/dL   RDW 12.1 11.5 - 15.5 %   Platelets 348 150 - 400 K/uL   nRBC 0.0 0.0 - 0.2 %    Comment: Performed at [facility], [place] [address], [place], [place] [address]  Glucose, capillary     Status: Abnormal   Collection Time: 10/05/21  1:31 PM  Result Value Ref Range   Glucose-Capillary 115 (H) 70 - 99 mg/dL    Comment: Glucose reference range applies only to samples taken after fasting for at least 8 hours.  Glucose, capillary     Status: Abnormal   Collection Time:  10/05/21  2:01 PM  Result Value Ref Range   Glucose-Capillary 183 (H) 70 - 99 mg/dL    Comment: Glucose reference range applies only to samples taken after fasting for at least 8 hours.  Glucose, capillary     Status: Abnormal   Collection Time: 10/05/21  2:32 PM  Result Value Ref Range   Glucose-Capillary 176 (H) 70 - 99 mg/dL    Comment: Glucose reference range applies only to samples taken after fasting for at least 8 hours.  Glucose, capillary     Status: Abnormal   Collection Time: 10/05/21  3:00 PM  Result Value Ref Range   Glucose-Capillary 215 (H) 70 - 99 mg/dL    Comment: Glucose reference range applies only to samples taken after fasting for at least 8 hours.  Comprehensive metabolic panel     Status: Abnormal   Collection Time: 10/05/21  3:19 PM  Result Value Ref Range   Sodium 133 (L) 135 - 145 mmol/L   Potassium 5.6 (H) 3.5 - 5.1 mmol/L   Chloride 107 98 - 111 mmol/L   CO2 18 (L) 22 -  32 mmol/L   Glucose, Bld 162 (H) 70 - 99 mg/dL    Comment: Glucose reference range applies only to samples taken after fasting for at least 8 hours.   BUN 11 6 - 20 mg/dL   Creatinine, Ser 0.90 0.44 - 1.00 mg/dL   Calcium 7.4 (L) 8.9 - 10.3 mg/dL   Total Protein 5.2 (L) 6.5 - 8.1 g/dL   Albumin 3.0 (L) 3.5 - 5.0 g/dL   AST 22 15 - 41 U/L   ALT 13 0 - 44 U/L   Alkaline Phosphatase 39 38 - 126 U/L   Total Bilirubin 0.6 0.3 - 1.2 mg/dL   GFR, Estimated >60 >60 mL/min    Comment: (NOTE) Calculated using the CKD-EPI Creatinine Equation (2021)    Anion gap 8 5 - 15    Comment: Performed at [facility], [place] [address], [place], [place] [address]  Magnesium     Status: Abnormal   Collection Time: 10/05/21  3:19 PM  Result Value Ref Range   Magnesium 3.4 (H) 1.7 - 2.4 mg/dL    Comment: Performed at [facility] [address] [place], [place] [address]  Lactic acid, plasma     Status: Abnormal   Collection Time: 10/05/21  3:19 PM  Result Value Ref Range   Lactic Acid,  Venous 4.9 (HH) 0.5 - 1.9 mmol/L    Comment: CRITICAL VALUE NOTED.  VALUE IS CONSISTENT WITH PREVIOUSLY REPORTED AND CALLED VALUE. Performed at [facility], [place] [address], [place], [place] [address]   Phosphorus     Status: Abnormal   Collection Time: 10/05/21  3:19 PM  Result Value Ref Range   Phosphorus 1.0 (LL) 2.5 - 4.6 mg/dL    Comment: CRITICAL RESULT CALLED TO, READ BACK BY AND VERIFIED WITH: [place] 0086 10/05/2021 BY R VERAAR Performed at [facility], [place] [address], [place], [place] [address]   Glucose, capillary     Status: Abnormal   Collection Time: 10/05/21  3:32 PM  Result Value Ref Range   Glucose-Capillary 153 (H) 70 - 99 mg/dL    Comment: Glucose reference range applies only to samples taken after fasting for at least 8 hours.  Glucose, capillary     Status: Abnormal   Collection Time: 10/05/21  4:10 PM  Result Value Ref Range   Glucose-Capillary 168 (H) 70 - 99 mg/dL    Comment: Glucose reference range applies only to samples taken after fasting for at least 8 hours.  Urinalysis, Routine w reflex microscopic Urine, Catheterized     Status: Abnormal   Collection Time: 10/05/21  4:14 PM  Result Value Ref Range   Color, Urine YELLOW YELLOW   APPearance HAZY (A) CLEAR   Specific Gravity, Urine 1.022 1.005 - 1.030   pH 5.0 5.0 - 8.0   Glucose, UA >=500 (A) NEGATIVE mg/dL   Hgb urine dipstick NEGATIVE NEGATIVE   Bilirubin Urine NEGATIVE NEGATIVE   Ketones, ur 5 (A) NEGATIVE mg/dL   Protein, ur NEGATIVE NEGATIVE mg/dL   Nitrite NEGATIVE NEGATIVE   Leukocytes,Ua NEGATIVE NEGATIVE   RBC / HPF 0-5 0 - 5 RBC/hpf   WBC, UA 0-5 0 - 5 WBC/hpf   Bacteria, UA RARE (A) NONE SEEN   Squamous Epithelial / LPF 0-5 0 - 5   Mucus PRESENT    Hyaline Casts, UA PRESENT     Comment: Performed at [facility], [address] [place], [place]  [address]  Rapid urine drug screen ([facility] performed)     Status: None   Collection Time: 10/05/21  4:14 PM   Result Value Ref Range   Opiates NONE DETECTED NONE DETECTED   Cocaine NONE DETECTED NONE DETECTED   Benzodiazepines NONE DETECTED NONE DETECTED   Amphetamines NONE DETECTED NONE DETECTED   Tetrahydrocannabinol NONE DETECTED NONE DETECTED   Barbiturates NONE DETECTED NONE DETECTED    Comment: (NOTE) DRUG SCREEN FOR MEDICAL PURPOSES ONLY.  IF CONFIRMATION IS NEEDED FOR ANY PURPOSE, NOTIFY LAB WITHIN 5 DAYS.  LOWEST DETECTABLE LIMITS FOR URINE DRUG SCREEN Drug Class                     Cutoff (ng/mL) Amphetamine and metabolites    1000 Barbiturate and metabolites    200 Benzodiazepine                 751 Tricyclics and metabolites     300 Opiates and metabolites        300 Cocaine and metabolites        300 THC                            50 Performed at [facility], [place] [address], [place], [place] [address]   Glucose, capillary     Status: Abnormal   Collection Time: 10/05/21  4:38 PM  Result Value Ref Range   Glucose-Capillary 197 (H) 70 - 99 mg/dL    Comment: Glucose reference range applies only to samples taken after fasting for at least 8 hours.  Glucose, capillary     Status: Abnormal   Collection Time: 10/05/21  5:01 PM  Result Value Ref Range   Glucose-Capillary 194 (H) 70 - 99 mg/dL    Comment: Glucose reference range applies only to samples taken after fasting for at least 8 hours.  Glucose, capillary     Status: Abnormal   Collection Time: 10/05/21  5:30 PM  Result Value Ref Range   Glucose-Capillary 169 (H) 70 - 99 mg/dL    Comment: Glucose reference range applies only to samples taken after fasting for at least 8 hours.  Glucose, capillary     Status: Abnormal   Collection Time: 10/05/21  6:14 PM  Result Value Ref Range   Glucose-Capillary 224 (H) 70 - 99 mg/dL    Comment: Glucose reference range applies only to samples taken after fasting for at least 8 hours.  Potassium     Status: None   Collection Time: 10/05/21  6:24 PM  Result Value Ref Range    Potassium 3.5 3.5 - 5.1 mmol/L    Comment: Performed at [facility], [place] [address] [place], [place] [address]  Lactic acid, plasma     Status: Abnormal   Collection Time: 10/05/21  6:24 PM  Result Value Ref Range   Lactic Acid, Venous 2.7 (HH) 0.5 - 1.9 mmol/L    Comment: CRITICAL VALUE NOTED.  VALUE IS CONSISTENT WITH PREVIOUSLY REPORTED AND CALLED VALUE. Performed at [facility], [place] [address], [place], [place] [address]   Glucose, capillary     Status: Abnormal   Collection Time: 10/05/21  7:08 PM  Result Value Ref Range   Glucose-Capillary 244 (H) 70 - 99 mg/dL    Comment: Glucose reference range applies only to samples taken after fasting for at least 8 hours.  Glucose, capillary  Status: Abnormal   Collection Time: 10/05/21  7:34 PM  Result Value Ref Range   Glucose-Capillary 260 (H) 70 - 99 mg/dL    Comment: Glucose reference range applies only to samples taken after fasting for at least 8 hours.  Glucose, capillary     Status: Abnormal   Collection Time: 10/05/21  8:00 PM  Result Value Ref Range   Glucose-Capillary 248 (H) 70 - 99 mg/dL    Comment: Glucose reference range applies only to samples taken after fasting for at least 8 hours.  Glucose, capillary     Status: Abnormal   Collection Time: 10/05/21  8:31 PM  Result Value Ref Range   Glucose-Capillary 249 (H) 70 - 99 mg/dL    Comment: Glucose reference range applies only to samples taken after fasting for at least 8 hours.  Potassium     Status: Abnormal   Collection Time: 10/05/21  8:35 PM  Result Value Ref Range   Potassium 3.4 (L) 3.5 - 5.1 mmol/L    Comment: Performed at [facility], [place] [address], [place], [place] [address]  Glucose, capillary     Status: Abnormal   Collection Time: 10/05/21  9:06 PM  Result Value Ref Range   Glucose-Capillary 279 (H) 70 - 99 mg/dL    Comment: Glucose reference range applies only to samples taken after fasting for at least 8 hours.  Glucose,  capillary     Status: Abnormal   Collection Time: 10/05/21  9:30 PM  Result Value Ref Range   Glucose-Capillary 248 (H) 70 - 99 mg/dL    Comment: Glucose reference range applies only to samples taken after fasting for at least 8 hours.  Glucose, capillary     Status: Abnormal   Collection Time: 10/05/21 10:00 PM  Result Value Ref Range   Glucose-Capillary 258 (H) 70 - 99 mg/dL    Comment: Glucose reference range applies only to samples taken after fasting for at least 8 hours.  Comprehensive metabolic panel     Status: Abnormal   Collection Time: 10/05/21 10:24 PM  Result Value Ref Range   Sodium 128 (L) 135 - 145 mmol/L   Potassium 3.2 (L) 3.5 - 5.1 mmol/L   Chloride 99 98 - 111 mmol/L   CO2 22 22 - 32 mmol/L   Glucose, Bld 369 (H) 70 - 99 mg/dL    Comment: Glucose reference range applies only to samples taken after fasting for at least 8 hours.   BUN <5 (L) 6 - 20 mg/dL   Creatinine, Ser 0.66 0.44 - 1.00 mg/dL   Calcium 6.9 (L) 8.9 - 10.3 mg/dL   Total Protein 5.3 (L) 6.5 - 8.1 g/dL   Albumin 3.0 (L) 3.5 - 5.0 g/dL   AST 17 15 - 41 U/L   ALT 14 0 - 44 U/L   Alkaline Phosphatase 43 38 - 126 U/L   Total Bilirubin 0.5 0.3 - 1.2 mg/dL   GFR, Estimated >60 >60 mL/min    Comment: (NOTE) Calculated using the CKD-EPI Creatinine Equation (2021)    Anion gap 7 5 - 15    Comment: Performed at [facility], [place] [address], [place], [place] [address]  Phosphorus     Status: Abnormal   Collection Time: 10/05/21 10:24 PM  Result Value Ref Range   Phosphorus 1.7 (L) 2.5 - 4.6 mg/dL    Comment: Performed at [facility] [address] [place], [place] [address]  Lactic acid, plasma  Status: Abnormal   Collection Time: 10/05/21 10:24 PM  Result Value Ref Range   Lactic Acid, Venous 2.4 (HH) 0.5 - 1.9 mmol/L    Comment: CRITICAL VALUE NOTED.  VALUE IS CONSISTENT WITH PREVIOUSLY REPORTED AND CALLED VALUE. Performed at [facility], [place] [address] [place], [place]  [address]   Glucose, capillary     Status: Abnormal   Collection Time: 10/05/21 10:30 PM  Result Value Ref Range   Glucose-Capillary 269 (H) 70 - 99 mg/dL    Comment: Glucose reference range applies only to samples taken after fasting for at least 8 hours.  Glucose, capillary     Status: Abnormal   Collection Time: 10/05/21 11:03 PM  Result Value Ref Range   Glucose-Capillary 269 (H) 70 - 99 mg/dL    Comment: Glucose reference range applies only to samples taken after fasting for at least 8 hours.  Glucose, capillary     Status: Abnormal   Collection Time: 10/05/21 11:31 PM  Result Value Ref Range   Glucose-Capillary 265 (H) 70 - 99 mg/dL    Comment: Glucose reference range applies only to samples taken after fasting for at least 8 hours.  Glucose, capillary     Status: Abnormal   Collection Time: 10/06/21 12:04 AM  Result Value Ref Range   Glucose-Capillary 265 (H) 70 - 99 mg/dL    Comment: Glucose reference range applies only to samples taken after fasting for at least 8 hours.  Potassium     Status: Abnormal   Collection Time: 10/06/21 12:08 AM  Result Value Ref Range   Potassium 2.9 (L) 3.5 - 5.1 mmol/L    Comment: Performed at [facility] [address], [place], [place] [address]  Glucose, capillary     Status: Abnormal   Collection Time: 10/06/21 12:29 AM  Result Value Ref Range   Glucose-Capillary 215 (H) 70 - 99 mg/dL    Comment: Glucose reference range applies only to samples taken after fasting for at least 8 hours.  Glucose, capillary     Status: Abnormal   Collection Time: 10/06/21  1:02 AM  Result Value Ref Range   Glucose-Capillary 230 (H) 70 - 99 mg/dL    Comment: Glucose reference range applies only to samples taken after fasting for at least 8 hours.  Glucose, capillary     Status: Abnormal   Collection Time: 10/06/21  1:31 AM  Result Value Ref Range   Glucose-Capillary 226 (H) 70 - 99 mg/dL    Comment: Glucose reference range applies only to samples  taken after fasting for at least 8 hours.  Glucose, capillary     Status: Abnormal   Collection Time: 10/06/21  2:02 AM  Result Value Ref Range   Glucose-Capillary 218 (H) 70 - 99 mg/dL    Comment: Glucose reference range applies only to samples taken after fasting for at least 8 hours.  Potassium     Status: Abnormal   Collection Time: 10/06/21  2:16 AM  Result Value Ref Range   Potassium 3.2 (L) 3.5 - 5.1 mmol/L    Comment: Performed at [facility] [address], [place], [place] [address]  Glucose, capillary     Status: Abnormal   Collection Time: 10/06/21  2:30 AM  Result Value Ref Range   Glucose-Capillary 207 (H) 70 - 99 mg/dL    Comment: Glucose reference range applies only to samples taken after fasting for at least 8 hours.  Glucose, capillary  Status: Abnormal   Collection Time: 10/06/21  3:03 AM  Result Value Ref Range   Glucose-Capillary 199 (H) 70 - 99 mg/dL    Comment: Glucose reference range applies only to samples taken after fasting for at least 8 hours.  Glucose, capillary     Status: Abnormal   Collection Time: 10/06/21  3:31 AM  Result Value Ref Range   Glucose-Capillary 175 (H) 70 - 99 mg/dL    Comment: Glucose reference range applies only to samples taken after fasting for at least 8 hours.  CBC     Status: Abnormal   Collection Time: 10/06/21  4:00 AM  Result Value Ref Range   WBC 23.7 (H) 4.0 - 10.5 K/uL   RBC 3.65 (L) 3.87 - 5.11 MIL/uL   Hemoglobin 11.4 (L) 12.0 - 15.0 g/dL   HCT 31.1 (L) 36.0 - 46.0 %   MCV 85.2 80.0 - 100.0 fL   MCH 31.2 26.0 - 34.0 pg   MCHC 36.7 (H) 30.0 - 36.0 g/dL   RDW 11.9 11.5 - 15.5 %   Platelets 247 150 - 400 K/uL   nRBC 0.0 0.0 - 0.2 %    Comment: Performed at [facility], [place] [address], [place], [place] [address]  Magnesium     Status: Abnormal   Collection Time: 10/06/21  4:00 AM  Result Value Ref Range   Magnesium 1.3 (L) 1.7 - 2.4 mg/dL    Comment: Performed at [facility]  [address], [place],  [address]  Potassium     Status: Abnormal   Collection Time: 10/06/21  4:00 AM  Result Value Ref Range   Potassium 2.9 (L) 3.5 - 5.1 mmol/L    Comment: Performed at [facility] [address], [place], [place] [address]  Glucose, capillary     Status: Abnormal   Collection Time: 10/06/21  4:03 AM  Result Value Ref Range   Glucose-Capillary 179 (H) 70 - 99 mg/dL    Comment: Glucose reference range applies only to samples taken after fasting for at least 8 hours.  Glucose, capillary     Status: Abnormal   Collection Time: 10/06/21  4:31 AM  Result Value Ref Range   Glucose-Capillary 185 (H) 70 - 99 mg/dL    Comment: Glucose reference range applies only to samples taken after fasting for at least 8 hours.  I-STAT 7, (LYTES, BLD GAS, ICA, H+H)     Status: Abnormal   Collection Time: 10/06/21  4:57 AM  Result Value Ref Range   pH, Arterial 7.343 (L) 7.35 - 7.45   pCO2 arterial 32.6 32 - 48 mmHg   pO2, Arterial 68 (L) 83 - 108 mmHg   Bicarbonate 17.8 (L) 20.0 - 28.0 mmol/L   TCO2 19 (L) 22 - 32 mmol/L   O2 Saturation 93 %   Acid-base deficit 7.0 (H) 0.0 - 2.0 mmol/L   Sodium 125 (L) 135 - 145 mmol/L   Potassium 2.9 (L) 3.5 - 5.1 mmol/L   Calcium, Ion 1.01 (L) 1.15 - 1.40 mmol/L   HCT 34.0 (L) 36.0 - 46.0 %   Hemoglobin 11.6 (L) 12.0 - 15.0 g/dL   Patient temperature 97.7 F    Collection site RADIAL, ALLEN'S TEST ACCEPTABLE    Drawn by RT    Sample type ARTERIAL   Glucose, capillary     Status: Abnormal   Collection Time: 10/06/21  5:02 AM  Result Value Ref Range   Glucose-Capillary 168 (  H) 70 - 99 mg/dL    Comment: Glucose reference range applies only to samples taken after fasting for at least 8 hours.  Glucose, capillary     Status: Abnormal   Collection Time: 10/06/21  5:25 AM  Result Value Ref Range   Glucose-Capillary 174 (H) 70 - 99 mg/dL    Comment: Glucose reference range applies only to samples taken after fasting for at least 8 hours.   Glucose, capillary     Status: Abnormal   Collection Time: 10/06/21  5:57 AM  Result Value Ref Range   Glucose-Capillary 169 (H) 70 - 99 mg/dL    Comment: Glucose reference range applies only to samples taken after fasting for at least 8 hours.  Comprehensive metabolic panel     Status: Abnormal   Collection Time: 10/06/21  5:58 AM  Result Value Ref Range   Sodium 119 (LL) 135 - 145 mmol/L    Comment: CRITICAL RESULT CALLED TO, READ BACK BY AND VERIFIED WITH: ERCOLANO H,RN 10/06/21 0642 WAYK    Potassium 2.7 (LL) 3.5 - 5.1 mmol/L    Comment: CRITICAL RESULT CALLED TO, READ BACK BY AND VERIFIED WITH: ERCOLANO H,RN 10/06/21 0642 WAYK    Chloride 91 (L) 98 - 111 mmol/L   CO2 21 (L) 22 - 32 mmol/L   Glucose, Bld 269 (H) 70 - 99 mg/dL    Comment: Glucose reference range applies only to samples taken after fasting for at least 8 hours.   BUN <5 (L) 6 - 20 mg/dL   Creatinine, Ser 0.56 0.44 - 1.00 mg/dL   Calcium 6.4 (LL) 8.9 - 10.3 mg/dL    Comment: CRITICAL RESULT CALLED TO, READ BACK BY AND VERIFIED WITH: ERCOLANO [facility] 10/06/21 0642 WAYK    Total Protein 5.2 (L) 6.5 - 8.1 g/dL   Albumin 3.0 (L) 3.5 - 5.0 g/dL   AST 16 15 - 41 U/L   ALT 13 0 - 44 U/L   Alkaline Phosphatase 41 38 - 126 U/L   Total Bilirubin 0.5 0.3 - 1.2 mg/dL   GFR, Estimated >60 >60 mL/min    Comment: (NOTE) Calculated using the CKD-EPI Creatinine Equation (2021)    Anion gap 7 5 - 15    Comment: Performed at [facility], [place] [address] [place], [place] [address]  Phosphorus     Status: Abnormal   Collection Time: 10/06/21  5:58 AM  Result Value Ref Range   Phosphorus 1.6 (L) 2.5 - 4.6 mg/dL    Comment: Performed at [facility] [address], [place], [place] [address]  Glucose, capillary     Status: Abnormal   Collection Time: 10/06/21  6:31 AM  Result Value Ref Range   Glucose-Capillary 191 (H) 70 - 99 mg/dL    Comment: Glucose reference range applies only to samples taken after fasting  for at least 8 hours.  Glucose, capillary     Status: Abnormal   Collection Time: 10/06/21  6:59 AM  Result Value Ref Range   Glucose-Capillary 198 (H) 70 - 99 mg/dL    Comment: Glucose reference range applies only to samples taken after fasting for at least 8 hours.  Glucose, capillary     Status: Abnormal   Collection Time: 10/06/21  7:33 AM  Result Value Ref Range   Glucose-Capillary 150 (H) 70 - 99 mg/dL    Comment: Glucose reference range applies only to samples taken after fasting for at least 8 hours.  Glucose, capillary  Status: Abnormal   Collection Time: 10/06/21  8:02 AM  Result Value Ref Range   Glucose-Capillary 197 (H) 70 - 99 mg/dL    Comment: Glucose reference range applies only to samples taken after fasting for at least 8 hours.  Glucose, capillary     Status: Abnormal   Collection Time: 10/06/21  8:32 AM  Result Value Ref Range   Glucose-Capillary 204 (H) 70 - 99 mg/dL    Comment: Glucose reference range applies only to samples taken after fasting for at least 8 hours.  Potassium     Status: Abnormal   Collection Time: 10/06/21  8:49 AM  Result Value Ref Range   Potassium 2.6 (LL) 3.5 - 5.1 mmol/L    Comment: CRITICAL RESULT CALLED TO, READ BACK BY AND VERIFIED WITH: N.[facility] 10/06/2021 AT 1007 A.HUGHES Performed at [facility], [place] [address], [place], [place] [address]   Cortisol     Status: None   Collection Time: 10/06/21  8:49 AM  Result Value Ref Range   Cortisol, Plasma 28.5 ug/dL    Comment: (NOTE) AM    6.7 - 22.6 ug/dL PM   <10.0       ug/dL Performed at [facility] [address] [place], [place] [address]   Glucose, capillary     Status: Abnormal   Collection Time: 10/06/21  9:12 AM  Result Value Ref Range   Glucose-Capillary 142 (H) 70 - 99 mg/dL    Comment: Glucose reference range applies only to samples taken after fasting for at least 8 hours.  Glucose, capillary     Status: Abnormal   Collection Time: 10/06/21   9:35 AM  Result Value Ref Range   Glucose-Capillary 318 (H) 70 - 99 mg/dL    Comment: Glucose reference range applies only to samples taken after fasting for at least 8 hours.  Glucose, capillary     Status: Abnormal   Collection Time: 10/06/21  9:56 AM  Result Value Ref Range   Glucose-Capillary 214 (H) 70 - 99 mg/dL    Comment: Glucose reference range applies only to samples taken after fasting for at least 8 hours.  Potassium     Status: Abnormal   Collection Time: 10/06/21 10:07 AM  Result Value Ref Range   Potassium 2.6 (LL) 3.5 - 5.1 mmol/L    Comment: CRITICAL RESULT CALLED TO, READ BACK BY AND VERIFIED WITH: N.MAERLENDER,RM 10/06/2021 AT 1107 A.HUGHES Performed at [facility], [place] [address], [place], [place] [address]   Glucose, capillary     Status: Abnormal   Collection Time: 10/06/21 10:27 AM  Result Value Ref Range   Glucose-Capillary 196 (H) 70 - 99 mg/dL    Comment: Glucose reference range applies only to samples taken after fasting for at least 8 hours.  Glucose, capillary     Status: Abnormal   Collection Time: 10/06/21 10:55 AM  Result Value Ref Range   Glucose-Capillary 190 (H) 70 - 99 mg/dL    Comment: Glucose reference range applies only to samples taken after fasting for at least 8 hours.  Glucose, capillary     Status: Abnormal   Collection Time: 10/06/21 11:24 AM  Result Value Ref Range   Glucose-Capillary 159 (H) 70 - 99 mg/dL    Comment: Glucose reference range applies only to samples taken after fasting for at least 8 hours.  Potassium     Status: Abnormal   Collection Time: 10/06/21 11:38 AM  Result Value Ref  Range   Potassium 2.8 (L) 3.5 - 5.1 mmol/L    Comment: Performed at [facility] [address] [place], [place] [address]  Glucose, capillary     Status: Abnormal   Collection Time: 10/06/21 11:57 AM  Result Value Ref Range   Glucose-Capillary 123 (H) 70 - 99 mg/dL    Comment: Glucose reference range applies only to samples  taken after fasting for at least 8 hours.  Glucose, capillary     Status: Abnormal   Collection Time: 10/06/21 12:31 PM  Result Value Ref Range   Glucose-Capillary 126 (H) 70 - 99 mg/dL    Comment: Glucose reference range applies only to samples taken after fasting for at least 8 hours.  Glucose, capillary     Status: Abnormal   Collection Time: 10/06/21  1:03 PM  Result Value Ref Range   Glucose-Capillary 116 (H) 70 - 99 mg/dL    Comment: Glucose reference range applies only to samples taken after fasting for at least 8 hours.  Glucose, capillary     Status: Abnormal   Collection Time: 10/06/21  1:30 PM  Result Value Ref Range   Glucose-Capillary 117 (H) 70 - 99 mg/dL    Comment: Glucose reference range applies only to samples taken after fasting for at least 8 hours.  Glucose, capillary     Status: Abnormal   Collection Time: 10/06/21  2:01 PM  Result Value Ref Range   Glucose-Capillary 120 (H) 70 - 99 mg/dL    Comment: Glucose reference range applies only to samples taken after fasting for at least 8 hours.  Comprehensive metabolic panel     Status: Abnormal   Collection Time: 10/06/21  2:09 PM  Result Value Ref Range   Sodium 125 (L) 135 - 145 mmol/L   Potassium 2.8 (L) 3.5 - 5.1 mmol/L   Chloride 93 (L) 98 - 111 mmol/L   CO2 23 22 - 32 mmol/L   Glucose, Bld 128 (H) 70 - 99 mg/dL    Comment: Glucose reference range applies only to samples taken after fasting for at least 8 hours.   BUN <5 (L) 6 - 20 mg/dL   Creatinine, Ser 0.47 0.44 - 1.00 mg/dL   Calcium 7.3 (L) 8.9 - 10.3 mg/dL   Total Protein 5.8 (L) 6.5 - 8.1 g/dL   Albumin 3.3 (L) 3.5 - 5.0 g/dL   AST 17 15 - 41 U/L   ALT 16 0 - 44 U/L   Alkaline Phosphatase 48 38 - 126 U/L   Total Bilirubin 0.4 0.3 - 1.2 mg/dL   GFR, Estimated >60 >60 mL/min    Comment: (NOTE) Calculated using the CKD-EPI Creatinine Equation (2021)    Anion gap 9 5 - 15    Comment: Performed at [facility], [place] [address]  [place], [place] [address]  Phosphorus     Status: None   Collection Time: 10/06/21  2:09 PM  Result Value Ref Range   Phosphorus 3.0 2.5 - 4.6 mg/dL    Comment: Performed at [facility] [address], [place], [place] [address]  Glucose, capillary     Status: Abnormal   Collection Time: 10/06/21  2:33 PM  Result Value Ref Range   Glucose-Capillary 124 (H) 70 - 99 mg/dL    Comment: Glucose reference range applies only to samples taken after fasting for at least 8 hours.  Glucose, capillary     Status: Abnormal   Collection Time: 10/06/21  3:01 PM  Result Value Ref Range   Glucose-Capillary 116 (H) 70 - 99 mg/dL    Comment: Glucose reference range applies only to samples taken after fasting for at least 8 hours.  Glucose, capillary     Status: Abnormal   Collection Time: 10/06/21  3:31 PM  Result Value Ref Range   Glucose-Capillary 120 (H) 70 - 99 mg/dL    Comment: Glucose reference range applies only to samples taken after fasting for at least 8 hours.  Glucose, capillary     Status: Abnormal   Collection Time: 10/06/21  4:02 PM  Result Value Ref Range   Glucose-Capillary 136 (H) 70 - 99 mg/dL    Comment: Glucose reference range applies only to samples taken after fasting for at least 8 hours.  Glucose, capillary     Status: Abnormal   Collection Time: 10/06/21  4:30 PM  Result Value Ref Range   Glucose-Capillary 115 (H) 70 - 99 mg/dL    Comment: Glucose reference range applies only to samples taken after fasting for at least 8 hours.  Glucose, capillary     Status: Abnormal   Collection Time: 10/06/21  5:02 PM  Result Value Ref Range   Glucose-Capillary 116 (H) 70 - 99 mg/dL    Comment: Glucose reference range applies only to samples taken after fasting for at least 8 hours.  Glucose, capillary     Status: Abnormal   Collection Time: 10/06/21  5:30 PM  Result Value Ref Range   Glucose-Capillary 123 (H) 70 - 99 mg/dL    Comment: Glucose reference range applies only to  samples taken after fasting for at least 8 hours.  Potassium     Status: Abnormal   Collection Time: 10/06/21  5:43 PM  Result Value Ref Range   Potassium 2.7 (LL) 3.5 - 5.1 mmol/L    Comment: CRITICAL RESULT CALLED TO, READ BACK BY AND VERIFIED WITH: N.[facility] 10/06/2021 AT 3532 A.HUGHES Performed at [facility], [place] [address] [place], [place] [address]   Glucose, capillary     Status: Abnormal   Collection Time: 10/06/21  6:08 PM  Result Value Ref Range   Glucose-Capillary 113 (H) 70 - 99 mg/dL    Comment: Glucose reference range applies only to samples taken after fasting for at least 8 hours.  Glucose, capillary     Status: Abnormal   Collection Time: 10/06/21  6:35 PM  Result Value Ref Range   Glucose-Capillary 128 (H) 70 - 99 mg/dL    Comment: Glucose reference range applies only to samples taken after fasting for at least 8 hours.  Glucose, capillary     Status: Abnormal   Collection Time: 10/06/21  7:01 PM  Result Value Ref Range   Glucose-Capillary 105 (H) 70 - 99 mg/dL    Comment: Glucose reference range applies only to samples taken after fasting for at least 8 hours.  Glucose, capillary     Status: Abnormal   Collection Time: 10/06/21  7:03 PM  Result Value Ref Range   Glucose-Capillary 119 (H) 70 - 99 mg/dL    Comment: Glucose reference range applies only to samples taken after fasting for at least 8 hours.  Glucose, capillary     Status: Abnormal   Collection Time: 10/06/21  7:36 PM  Result Value Ref Range   Glucose-Capillary 112 (H) 70 - 99 mg/dL    Comment: Glucose reference range applies only to samples taken after fasting for at least 8 hours.  Potassium     Status: Abnormal   Collection Time: 10/06/21  7:50 PM  Result Value Ref Range   Potassium 3.3 (L) 3.5 - 5.1 mmol/L    Comment: DELTA CHECK NOTED Performed at [facility], [place] [address] [place], [place] [address]   magnesium level     Status: None   Collection Time: 10/06/21  7:50  PM  Result Value Ref Range   Magnesium 2.1 1.7 - 2.4 mg/dL    Comment: Performed at [facility] [address], [place], [place] [address]  Glucose, capillary     Status: Abnormal   Collection Time: 10/06/21  7:58 PM  Result Value Ref Range   Glucose-Capillary 162 (H) 70 - 99 mg/dL    Comment: Glucose reference range applies only to samples taken after fasting for at least 8 hours.  Glucose, capillary     Status: Abnormal   Collection Time: 10/06/21  8:40 PM  Result Value Ref Range   Glucose-Capillary 147 (H) 70 - 99 mg/dL    Comment: Glucose reference range applies only to samples taken after fasting for at least 8 hours.  Glucose, capillary     Status: Abnormal   Collection Time: 10/06/21  9:03 PM  Result Value Ref Range   Glucose-Capillary 156 (H) 70 - 99 mg/dL    Comment: Glucose reference range applies only to samples taken after fasting for at least 8 hours.  Glucose, capillary     Status: Abnormal   Collection Time: 10/06/21  9:39 PM  Result Value Ref Range   Glucose-Capillary 142 (H) 70 - 99 mg/dL    Comment: Glucose reference range applies only to samples taken after fasting for at least 8 hours.  Potassium     Status: None   Collection Time: 10/06/21 10:01 PM  Result Value Ref Range   Potassium 3.8 3.5 - 5.1 mmol/L    Comment: Performed at [facility], [place] [address], [place], [place] [address]  Glucose, capillary     Status: Abnormal   Collection Time: 10/06/21 10:04 PM  Result Value Ref Range   Glucose-Capillary 188 (H) 70 - 99 mg/dL    Comment: Glucose reference range applies only to samples taken after fasting for at least 8 hours.  Glucose, capillary     Status: Abnormal   Collection Time: 10/06/21 10:32 PM  Result Value Ref Range   Glucose-Capillary 134 (H) 70 - 99 mg/dL    Comment: Glucose reference range applies only to samples taken after fasting for at least 8 hours.  Comprehensive metabolic panel     Status: Abnormal   Collection Time:  10/06/21 11:01 PM  Result Value Ref Range   Sodium 137 135 - 145 mmol/L    Comment: DELTA CHECK NOTED   Potassium 3.5 3.5 - 5.1 mmol/L   Chloride 105 98 - 111 mmol/L   CO2 25 22 - 32 mmol/L   Glucose, Bld 155 (H) 70 - 99 mg/dL    Comment: Glucose reference range applies only to samples taken after fasting for at least 8 hours.   BUN <5 (L) 6 - 20 mg/dL   Creatinine, Ser 0.67 0.44 - 1.00 mg/dL   Calcium 7.6 (L) 8.9 - 10.3 mg/dL   Total Protein 5.8 (L) 6.5 - 8.1 g/dL   Albumin 3.0 (L) 3.5 - 5.0 g/dL   AST 22 15 - 41 U/L   ALT 19 0 - 44 U/L   Alkaline Phosphatase 50 38 -  126 U/L   Total Bilirubin 0.4 0.3 - 1.2 mg/dL   GFR, Estimated >60 >60 mL/min    Comment: (NOTE) Calculated using the CKD-EPI Creatinine Equation (2021)    Anion gap 7 5 - 15    Comment: Performed at [facility] [address], [place], [place] [address]  Phosphorus     Status: Abnormal   Collection Time: 10/06/21 11:01 PM  Result Value Ref Range   Phosphorus 1.6 (L) 2.5 - 4.6 mg/dL    Comment: Performed at [facility] [address], [place], [place] [address]  Glucose, capillary     Status: Abnormal   Collection Time: 10/06/21 11:03 PM  Result Value Ref Range   Glucose-Capillary 162 (H) 70 - 99 mg/dL    Comment: Glucose reference range applies only to samples taken after fasting for at least 8 hours.  Glucose, capillary     Status: Abnormal   Collection Time: 10/06/21 11:31 PM  Result Value Ref Range   Glucose-Capillary 137 (H) 70 - 99 mg/dL    Comment: Glucose reference range applies only to samples taken after fasting for at least 8 hours.  Glucose, capillary     Status: Abnormal   Collection Time: 10/07/21 12:08 AM  Result Value Ref Range   Glucose-Capillary 166 (H) 70 - 99 mg/dL    Comment: Glucose reference range applies only to samples taken after fasting for at least 8 hours.  Glucose, capillary     Status: Abnormal   Collection Time: 10/07/21 12:35 AM  Result Value Ref Range    Glucose-Capillary 127 (H) 70 - 99 mg/dL    Comment: Glucose reference range applies only to samples taken after fasting for at least 8 hours.  Glucose, capillary     Status: Abnormal   Collection Time: 10/07/21  1:14 AM  Result Value Ref Range   Glucose-Capillary 141 (H) 70 - 99 mg/dL    Comment: Glucose reference range applies only to samples taken after fasting for at least 8 hours.  Glucose, capillary     Status: Abnormal   Collection Time: 10/07/21  1:42 AM  Result Value Ref Range   Glucose-Capillary 139 (H) 70 - 99 mg/dL    Comment: Glucose reference range applies only to samples taken after fasting for at least 8 hours.  Glucose, capillary     Status: Abnormal   Collection Time: 10/07/21  3:05 AM  Result Value Ref Range   Glucose-Capillary 153 (H) 70 - 99 mg/dL    Comment: Glucose reference range applies only to samples taken after fasting for at least 8 hours.  Glucose, capillary     Status: Abnormal   Collection Time: 10/07/21  3:33 AM  Result Value Ref Range   Glucose-Capillary 163 (H) 70 - 99 mg/dL    Comment: Glucose reference range applies only to samples taken after fasting for at least 8 hours.  Glucose, capillary     Status: Abnormal   Collection Time: 10/07/21  4:03 AM  Result Value Ref Range   Glucose-Capillary 200 (H) 70 - 99 mg/dL    Comment: Glucose reference range applies only to samples taken after fasting for at least 8 hours.  Phosphorus     Status: None   Collection Time: 10/07/21  4:30 AM  Result Value Ref Range   Phosphorus 2.8 2.5 - 4.6 mg/dL    Comment: Performed at [facility] [address], [place], [place] [address]  Magnesium  Status: None   Collection Time: 10/07/21  4:35 AM  Result Value Ref Range   Magnesium 1.9 1.7 - 2.4 mg/dL    Comment: Performed at [facility], [place] [address], [place], [place] [address]  CBC     Status: Abnormal   Collection Time: 10/07/21  4:35 AM  Result Value Ref Range   WBC 20.9 (H) 4.0 - 10.5 K/uL    RBC 4.36 3.87 - 5.11 MIL/uL   Hemoglobin 13.4 12.0 - 15.0 g/dL   HCT 36.5 36.0 - 46.0 %   MCV 83.7 80.0 - 100.0 fL   MCH 30.7 26.0 - 34.0 pg   MCHC 36.7 (H) 30.0 - 36.0 g/dL   RDW 12.3 11.5 - 15.5 %   Platelets 353 150 - 400 K/uL   nRBC 0.0 0.0 - 0.2 %    Comment: Performed at [facility], [place] [address], [place], [place] [address]  Basic metabolic panel     Status: Abnormal   Collection Time: 10/07/21  4:35 AM  Result Value Ref Range   Sodium 138 135 - 145 mmol/L   Potassium 3.6 3.5 - 5.1 mmol/L   Chloride 106 98 - 111 mmol/L   CO2 24 22 - 32 mmol/L   Glucose, Bld 146 (H) 70 - 99 mg/dL    Comment: Glucose reference range applies only to samples taken after fasting for at least 8 hours.   BUN <5 (L) 6 - 20 mg/dL   Creatinine, Ser 0.80 0.44 - 1.00 mg/dL   Calcium 7.7 (L) 8.9 - 10.3 mg/dL   GFR, Estimated >60 >60 mL/min    Comment: (NOTE) Calculated using the CKD-EPI Creatinine Equation (2021)    Anion gap 8 5 - 15    Comment: Performed at [facility] [address] [place], [place] [address]  Glucose, capillary     Status: Abnormal   Collection Time: 10/07/21  5:34 AM  Result Value Ref Range   Glucose-Capillary 131 (H) 70 - 99 mg/dL    Comment: Glucose reference range applies only to samples taken after fasting for at least 8 hours.  Glucose, capillary     Status: Abnormal   Collection Time: 10/07/21  6:17 AM  Result Value Ref Range   Glucose-Capillary 108 (H) 70 - 99 mg/dL    Comment: Glucose reference range applies only to samples taken after fasting for at least 8 hours.  Glucose, capillary     Status: Abnormal   Collection Time: 10/07/21  7:04 AM  Result Value Ref Range   Glucose-Capillary 111 (H) 70 - 99 mg/dL    Comment: Glucose reference range applies only to samples taken after fasting for at least 8 hours.  Glucose, capillary     Status: Abnormal   Collection Time: 10/07/21  8:01 AM  Result Value Ref Range   Glucose-Capillary 123 (H) 70 - 99  mg/dL    Comment: Glucose reference range applies only to samples taken after fasting for at least 8 hours.    Current Facility-Administered Medications  Medication Dose Route Frequency Provider Last Rate Last Admin   0.9 %  sodium chloride infusion   Intravenous Continuous Chesley Mires, MD 50 mL/hr at 10/07/21 1000 Infusion Verify at 10/07/21 1000   Chlorhexidine Gluconate Cloth 2 % PADS 6 each  6 each Topical Daily Elsie Lincoln, MD   6 each at 10/06/21 1549   docusate sodium (COLACE) capsule 100 mg  100 mg Oral BID PRN Minor, Gwyndolyn Saxon  S, NP       DOPamine (INTROPIN) 800 mg in dextrose 5 % 250 mL (3.2 mg/mL) infusion  0-20 mcg/kg/min Intravenous Titrated Chesley Mires, MD 24.2 mL/hr at 10/07/21 1000 15 mcg/kg/min at 10/07/21 1000   enoxaparin (LOVENOX) injection 40 mg  40 mg Subcutaneous Q24H Chesley Mires, MD   40 mg at 10/07/21 0958   polyethylene glycol (MIRALAX / GLYCOLAX) packet 17 g  17 g Oral Daily PRN Minor, Grace Bushy, NP       prochlorperazine (COMPAZINE) injection 10 mg  10 mg Intravenous Q4H PRN Chesley Mires, MD        Musculoskeletal: Strength & Muscle Tone: within normal limits Gait & Station: normal Patient leans: N/A  Psychiatric Specialty Exam: Physical Exam Vitals and nursing note reviewed.  Constitutional:      Appearance: Normal appearance.  HENT:     Head: Normocephalic.  Cardiovascular:     Rate and Rhythm: Bradycardia present.  Pulmonary:     Effort: Pulmonary effort is normal. No respiratory distress.  Musculoskeletal:     Cervical back: Normal range of motion.  Neurological:     General: No focal deficit present.     Mental Status: She is alert and oriented to person, place, and time.  Psychiatric:        Attention and Perception: Attention and perception normal.        Mood and Affect: Mood is anxious and depressed.        Speech: Speech normal.        Behavior: Behavior is cooperative.        Thought Content: Thought content includes suicidal ideation.  Thought content includes suicidal plan.        Cognition and Memory: Cognition and memory normal.        Judgment: Judgment is impulsive.    Review of Systems  Psychiatric/Behavioral:  Positive for depression, dysphoric mood and sleep disturbance (due to ICU setting). Negative for agitation, behavioral problems, confusion, decreased concentration, hallucinations (denies current, was having CAH prior to hospitalization) and suicidal ideas. The patient is nervous/anxious.   All other systems reviewed and are negative.  Blood pressure 134/60, pulse (!) 58, temperature 99.8 F (37.7 C), temperature source Oral, resp. rate 18, height '5\' 4"'$  (1.626 m), weight 85.9 kg, SpO2 96 %.Body mass index is 32.51 kg/m.  General Appearance: Casual, Fairly Groomed, and sedated with multiple IV lines  Eye Contact:  Good  Speech:  Slow  Volume:  Decreased  Mood:  Anxious and Dysphoric  Affect:  Congruent  Thought Process:  Coherent and Descriptions of Associations: Intact  Orientation:  Full (Time, Place, and Person)  Thought Content:  Hallucinations: Auditory, prior to admission, denies  Suicidal Thoughts:  No, denies today  Homicidal Thoughts:  No  Memory:  Fair  Judgement:  Fair  Insight:  Shallow  Psychomotor Activity:  Decreased  Concentration:  Concentration: Fair and Attention Span: Fair  Recall:  [employer] of Knowledge:  NA  Language:  Good  Akathisia:  No  Handed:  Right  AIMS (if indicated):     Assets:  Desire for Improvement Housing Resilience Social Support  ADL's:  Impaired  Cognition:  WNL  Sleep:   Fair, while in ICU     Physical Exam: Physical Exam Vitals and nursing note reviewed.  Constitutional:      Appearance: Normal appearance.  HENT:     Head: Normocephalic.  Cardiovascular:     Rate and Rhythm: Bradycardia present.  Pulmonary:     Effort: Pulmonary effort is normal. No respiratory distress.  Musculoskeletal:     Cervical back: Normal range of motion.   Neurological:     General: No focal deficit present.     Mental Status: She is alert and oriented to person, place, and time.  Psychiatric:        Attention and Perception: Attention and perception normal.        Mood and Affect: Mood is anxious and depressed.        Speech: Speech normal.        Behavior: Behavior is cooperative.        Thought Content: Thought content includes suicidal ideation. Thought content includes suicidal plan.        Cognition and Memory: Cognition and memory normal.        Judgment: Judgment is impulsive.   Review of Systems  Psychiatric/Behavioral:  Positive for depression, dysphoric mood and sleep disturbance (due to ICU setting). Negative for agitation, behavioral problems, confusion, decreased concentration, hallucinations (denies current, was having CAH prior to hospitalization) and suicidal ideas. The patient is nervous/anxious.   All other systems reviewed and are negative. Blood pressure 134/60, pulse (!) 58, temperature 99.8 F (37.7 C), temperature source Oral, resp. rate 18, height '5\' 4"'$  (1.626 m), weight 85.9 kg, SpO2 96 %. Body mass index is 32.51 kg/m.  Treatment Plan Summary: Daily contact with patient to assess and evaluate symptoms and progress in treatment, Medication management, and Plan : Major depressive disorder, recurrent, severe with psychosis.  Continue to hold psychotropic medications at this time.  Recommend low-dose Haldol 2 mg every 8 hours as needed for hallucinations or agitation.  This can be administered IV. Continue to monitor QTc prolongation should Haldol be started.  Psychiatry will continue to follow for medication recommendations as patient stabilizes.   Disposition: Recommend psychiatric Inpatient admission when medically cleared. I have reviewed with the patient, her sister and mother for probable psychiatric admission after medically cleared.  Patient is able to verbalize understanding.   Leslie Hammock,  MD 10/07/2021 12:41 PM

## 2021-10-07 NOTE — Progress Notes (Signed)
Initial Nutrition Assessment ? ?DOCUMENTATION CODES:  ?Not applicable ? ?INTERVENTION:  ?Continue to advance diet to regular as pt's mental status improves and diet is tolerated ?Encourage PO intake ?Boost Breeze po TID, each supplement provides 250 kcal and 9 grams of protein  ? ?NUTRITION DIAGNOSIS:  ?Inadequate oral intake related to lethargy/confusion (and restricted diet) as evidenced by  (pt with some sedation present and current diet order inadequate to meet estimated needs). ? ?GOAL:  ?Patient will meet greater than or equal to 90% of their needs ? ?MONITOR:  ?PO intake, Diet advancement, Supplement acceptance, Labs ? ?REASON FOR ASSESSMENT:  ?Malnutrition Screening Tool ?  ? ?ASSESSMENT:  ?Pt with history of depression with recurrent suicidal ideation, anxiety, and anemia brought to ED for intentional drug overdose of abilify, ativan, and propranolol. Pt with significant bradycardia and electrolyte abnormalities on admission. ? ?Pt remains in ICU at this time on dopamine drip. Diet advanced to clear liquids this AM. Noted that husband reported pt had stopped eating PTA. Pt's mother also reports pt have auditory hallucinations leading up to suicide attempt. Hx of psychiatric admission and ECT in past. ? ?Electrolytes being monitored and replaced, appear to be stabilizing. ? ?No intake recorded yet this admission and limited weight hx available. Pt does appear to have gained weight since last encounter. Noted that some edema is present which could be artificially elevating weight ? ?Will add nutrition supplements and monitor for diet advancement to adjust interventions as needed. ? ?Nutritionally Relevant Medications: ?Continuous Infusions: ? sodium chloride 50 mL/hr at 10/07/21 1000  ? DOPamine 15 mcg/kg/min (10/07/21 1000)  ? ?PRN Meds: docusate sodium, polyethylene glycol, prochlorperazine ? ?Labs Reviewed  ? ?NUTRITION - FOCUSED PHYSICAL EXAM: ?Defer to in-person assessment ? ?Diet Order:   ?Diet Order    ? ?       ?  Diet clear liquid Room service appropriate? Yes; Fluid consistency: Thin  Diet effective now       ?  ? ?  ?  ? ?  ? ? ?EDUCATION NEEDS:  ?Not appropriate for education at this time ? ?Skin:  Skin Assessment: Reviewed RN Assessment ? ?Last BM:  3/1 per RN documentation ? ?Height:  ?Ht Readings from Last 1 Encounters:  ?10/05/21 '5\' 4"'$  (1.626 m)  ? ? ?Weight:  ?Wt Readings from Last 1 Encounters:  ?10/06/21 85.9 kg  ? ? ?Ideal Body Weight:  54.5 kg ? ?BMI:  Body mass index is 32.51 kg/m?. ? ?Estimated Nutritional Needs:  ?Kcal:  1600-1800 kcal/d ?Protein:  80-90 g/d ?Fluid:  >/= 1.8L/d ? ? ?Ranell Patrick, RD, LDN ?Clinical Dietitian ?RD pager # available in [place]  ?After hours/weekend pager # available in [place] ?

## 2021-10-08 LAB — CBC
HCT: 38.2 % (ref 36.0–46.0)
Hemoglobin: 13.8 g/dL (ref 12.0–15.0)
MCH: 31.4 pg (ref 26.0–34.0)
MCHC: 36.1 g/dL — ABNORMAL HIGH (ref 30.0–36.0)
MCV: 87 fL (ref 80.0–100.0)
Platelets: 312 10*3/uL (ref 150–400)
RBC: 4.39 MIL/uL (ref 3.87–5.11)
RDW: 12.5 % (ref 11.5–15.5)
WBC: 13.7 10*3/uL — ABNORMAL HIGH (ref 4.0–10.5)
nRBC: 0 % (ref 0.0–0.2)

## 2021-10-08 LAB — BASIC METABOLIC PANEL
Anion gap: 7 (ref 5–15)
BUN: 5 mg/dL — ABNORMAL LOW (ref 6–20)
CO2: 29 mmol/L (ref 22–32)
Calcium: 8.4 mg/dL — ABNORMAL LOW (ref 8.9–10.3)
Chloride: 104 mmol/L (ref 98–111)
Creatinine, Ser: 0.82 mg/dL (ref 0.44–1.00)
GFR, Estimated: >60 mL/min (ref >60)
Glucose, Bld: 103 mg/dL — ABNORMAL HIGH (ref 70–99)
Potassium: 3.8 mmol/L (ref 3.5–5.1)
Sodium: 140 mmol/L (ref 135–145)

## 2021-10-08 LAB — PHOSPHORUS: Phosphorus: 2.8 mg/dL (ref 2.5–4.6)

## 2021-10-08 LAB — MAGNESIUM: Magnesium: 1.9 mg/dL (ref 1.7–2.4)

## 2021-10-08 MED ORDER — HALOPERIDOL 1 MG PO TABS
2.0000 mg | ORAL_TABLET | Freq: Three times a day (TID) | ORAL | Status: DC | PRN
  Filled 2021-10-08: qty 2

## 2021-10-08 MED ORDER — MAGNESIUM SULFATE IN D5W 1-5 GM/100ML-% IV SOLN
1.0000 g | Freq: Once | INTRAVENOUS | Status: AC
  Administered 2021-10-08: 1 g via INTRAVENOUS
  Filled 2021-10-08: qty 100

## 2021-10-08 NOTE — Consult Note (Signed)
Cardiology Consultation:   Patient ID: Leslie Elliott MRN: [id number]; DOB: 06/06/1976  Admit date: 10/05/2021 Date of Consult: 10/08/2021  PCP:  Leslie Crouch, MD   [facility] Providers Cardiologist:  None        Patient Profile:   Leslie Elliott is a 46 y.o. female with no prior cardiac history who is being seen 10/08/2021 for the evaluation of bradycardia at the request of Dr. Tacy Elliott.  History of Present Illness:   Leslie Elliott  is status post overdose with beta blocker.   She also took inderal, Abilify, venlafaxine, and ativan.  The ingestion of the beta blocker was at 0815 in the AM on 3/10.  EMS was activated and the patient was recorded to have a BP of 60/40.  She wasa treated with glucagon in the field, NS and epi drip.  HR was 40s - 50s.   In the the she was treated with epinephrine, D10, insulin, and calcium gluconate.    Potassium, calcium and mag were supplemented.  She did not get gastric lavage.    QTc was not prolonged.    She has remained bradycardic with sinus bradycardia and hypotension.  She has continued to require dopamine IV although epi was weaned off.    She says that she had an old prescription for propranolol.    She was given this for anxiety. She thinks the bottle was full. I do not know if this was long acting but I suspect not.  She will not have this prescription active in the pharmacy because it was old.  The patient denies any new symptoms  prior to admission such as chest discomfort, neck or arm discomfort. There has been no new shortness of breath, PND or orthopnea. There have been no reported palpitations, presyncope or syncope.     She feels OK in the hospital and has been up to the bathroom without dizziness or presyncope.    Past Medical History:  Diagnosis Date   Anemia    Anxiety    Depression    Ovarian cyst     Past Surgical History:  Procedure Laterality Date   ECT TREATMENTS     ROBOTIC ASSISTED BILATERAL SALPINGO OOPHERECTOMY  Left 10/30/2020   Procedure: XI ROBOTIC ASSISTED SALPINGO OOPHORECTOMY;  Surgeon: Benjaman Kindler, MD;  Location: [facility] ORS;  Service: Gynecology;  Laterality: Left;   ROBOTIC ASSISTED LAPAROSCOPIC OVARIAN CYSTECTOMY Bilateral 10/30/2020   Procedure: XI ROBOTIC ASSISTED LAPAROSCOPIC OVARIAN CYSTECTOMY;  Surgeon: Benjaman Kindler, MD;  Location: [facility] ORS;  Service: Gynecology;  Laterality: Bilateral;   TUBAL LIGATION  2002   XI ROBOTIC ASSISTED SALPINGECTOMY Right 10/30/2020   Procedure: XI ROBOTIC ASSISTED SALPINGECTOMY;  Surgeon: Benjaman Kindler, MD;  Location: [facility] ORS;  Service: Gynecology;  Laterality: Right;     Home Medications:  Prior to Admission medications   Medication Sig Start Date End Date Taking? Authorizing Provider  ARIPiprazole (ABILIFY) 5 MG tablet Take 1 tablet (5 mg total) by mouth at bedtime. 03/28/21  Yes Salley Scarlet, MD  buPROPion [facility] SR) 150 MG 12 hr tablet Take 300 mg by mouth daily. 07/07/21  Yes [provider]  mirtazapine (REMERON) 15 MG tablet Take 15 mg by mouth daily. 09/28/21  Yes [provider]  venlafaxine XR (EFFEXOR-XR) 37.5 MG 24 hr capsule Take 37.5 mg by mouth daily. 09/28/21  Yes [provider]  vitamin B-12 (CYANOCOBALAMIN) 1000 MCG tablet Take 1,000 mcg by mouth daily.   Yes [provider]  Inpatient Medications: Scheduled Meds:  Chlorhexidine Gluconate Cloth  6 each Topical Daily   enoxaparin (LOVENOX) injection  40 mg Subcutaneous Q24H   feeding supplement  1 Container Oral TID BM   Continuous Infusions:  DOPamine 8 mcg/kg/min (10/08/21 1000)   PRN Meds: acetaminophen, docusate sodium, haloperidol, polyethylene glycol, prochlorperazine  Allergies:    Allergies  Allergen Reactions   Zofran [Ondansetron Hcl] Rash    Social History:   Social History   Socioeconomic History   Marital status: Married    Spouse name: Herbie [place]   Number of children: Not on file   Years of education: Not on file    Highest education level: Not on file  Occupational History   Not on file  Tobacco Use   Smoking status: Never   Smokeless tobacco: Never  Vaping Use   Vaping Use: Never used  Substance and Sexual Activity   Alcohol use: Yes    Comment: occassional   Drug use: Never   Sexual activity: Not on file  Other Topics Concern   Not on file  Social History Narrative   Not on file   Social Determinants of Health   Financial Resource Strain: Not on file  Food Insecurity: Not on file  Transportation Needs: Not on file  Physical Activity: Not on file  Stress: Not on file  Social Connections: Not on file  Intimate Partner Violence: Not on file    Family History:     Father with ischemic cardiomyopathy and ICD/CRT  Family History  Problem Relation Age of Onset   Breast cancer Neg Hx      ROS:  Please see the history of present illness.   All other ROS reviewed and negative.     Physical Exam/Data:   Vitals:   10/08/21 1400 10/08/21 1430 10/08/21 1500 10/08/21 1537  BP: (!) 91/43 (!) 97/48 (!) 106/50   Pulse: (!) 46 (!) 52 (!) 46   Resp: '16 16 14   '$ Temp:    98.4 F (36.9 C)  TempSrc:    Oral  SpO2: 96% 96% 97%   Weight:      Height:        Intake/Output Summary (Last 24 hours) at 10/08/2021 1546 Last data filed at 10/08/2021 1222 Gross per 24 hour  Intake 2857.96 ml  Output --  Net 2857.96 ml   Last 3 Weights 10/06/2021 10/05/2021 03/25/2021  Weight (lbs) 189 lb 6 oz 158 lb 157 lb  Weight (kg) 85.9 kg 71.668 kg 71.215 kg  Some encounter information is confidential and restricted. Go to Review Flowsheets activity to see all data.     Body mass index is 32.51 kg/m.  GENERAL:  Well appearing HEENT:  Pupils equal round and reactive, fundi not visualized, oral mucosa unremarkable NECK:  No jugular venous distention, waveform within normal limits, carotid upstroke brisk and symmetric, no bruits, no thyromegaly LYMPHATICS:  No cervical, inguinal adenopathy LUNGS:   Clear to auscultation bilaterally BACK:  No CVA tenderness CHEST:  Unremarkable HEART:  PMI not displaced or sustained,S1 and S2 within normal limits, no S3, no S4, no clicks, no rubs, no murmurs ABD:  Flat, positive bowel sounds normal in frequency in pitch, no bruits, no rebound, no guarding, no midline pulsatile mass, no hepatomegaly, no splenomegaly EXT:  2 plus pulses throughout, no edema, no cyanosis no clubbing SKIN:  No rashes no nodules NEURO:  Cranial nerves II through XII grossly intact, motor grossly intact throughout PSYCH:  Cognitively intact, oriented to  person place and time   EKG:  The EKG was personally reviewed and demonstrates:  SB, rate 40, axis WNL, intervals WNL, no acute ST T wave changes.  Telemetry:  Telemetry was personally reviewed and demonstrates:    Relevant CV Studies: NA  Laboratory Data:  High Sensitivity Troponin:  No results for input(s): TROPONINIHS in the last 720 hours.   Chemistry Recent Labs  Lab 10/06/21 1950 10/06/21 2201 10/06/21 2301 10/07/21 0435 10/08/21 0511  NA  --   --  137 138 140  K 3.3*   < > 3.5 3.6 3.8  CL  --   --  105 106 104  CO2  --   --  '25 24 29  '$ GLUCOSE  --   --  155* 146* 103*  BUN  --   --  <5* <5* 5*  CREATININE  --   --  0.67 0.80 0.82  CALCIUM  --   --  7.6* 7.7* 8.4*  MG 2.1  --   --  1.9 1.9  GFRNONAA  --   --  >60 >60 >60  ANIONGAP  --   --  '7 8 7   '$ < > = values in this interval not displayed.    Recent Labs  Lab 10/06/21 0558 10/06/21 1409 10/06/21 2301  PROT 5.2* 5.8* 5.8*  ALBUMIN 3.0* 3.3* 3.0*  AST '16 17 22  '$ ALT '13 16 19  '$ ALKPHOS 41 48 50  BILITOT 0.5 0.4 0.4   Lipids No results for input(s): CHOL, TRIG, HDL, LABVLDL, LDLCALC, CHOLHDL in the last 168 hours.  Hematology Recent Labs  Lab 10/06/21 0400 10/06/21 0457 10/07/21 0435 10/08/21 0511  WBC 23.7*  --  20.9* 13.7*  RBC 3.65*  --  4.36 4.39  HGB 11.4* 11.6* 13.4 13.8  HCT 31.1* 34.0* 36.5 38.2  MCV 85.2  --  83.7 87.0  MCH  31.2  --  30.7 31.4  MCHC 36.7*  --  36.7* 36.1*  RDW 11.9  --  12.3 12.5  PLT 247  --  353 312   Thyroid  Recent Labs  Lab 10/05/21 1026  TSH 0.577    BNPNo results for input(s): BNP, PROBNP in the last 168 hours.  DDimer No results for input(s): DDIMER in the last 168 hours.   Radiology/Studies:  [facility] Chest Port 1 View  Result Date: 10/06/2021 CLINICAL DATA:  Drug overdose.  Respiratory failure. EXAM: PORTABLE CHEST 1 VIEW COMPARISON:  10/05/2021 FINDINGS: A left subclavian catheter terminates over the SVC, unchanged. An enteric tube courses into the abdomen with tip not imaged. The cardiomediastinal silhouette is unchanged. Defibrillator pads and telemetry leads overlie the chest. Lung volumes remain low. New mild perihilar and infrahilar opacities are present bilaterally. No sizable pleural effusion or pneumothorax is identified. IMPRESSION: New mild bilateral perihilar and infrahilar opacities, potentially vascular congestion/early edema and atelectasis although aspiration and infection are also possible. Electronically Signed   By: Logan Bores M.D.   On: 10/06/2021 12:01   DG CHEST PORT 1 VIEW  Result Date: 10/05/2021 CLINICAL DATA:  encounter for NG tube placement, and encounter for central line placement EXAM: PORTABLE CHEST and abdomen 1 VIEW. Patient is rotated on chest. Collimation of the right abdomen and pelvis on abdominal radiograph. COMPARISON:  Chest x-ray 10/05/2021 FINDINGS: Cardiac paddles overlie the chest. Enteric tube with tip coursing below the hemidiaphragm and tip and side port overlying the gastric lumen. The heart and mediastinal contours are within normal limits. Low lung volumes.  No focal consolidation. No pulmonary edema. No pleural effusion. No pneumothorax. Nonobstructive bowel gas pattern. No acute osseous abnormality. IMPRESSION: 1. Enteric tube in good position. 2. Low lung volumes with no acute cardiopulmonary abnormality. 3. Nonobstructive bowel gas pattern  of the visualized upper left abdomen. Electronically Signed   By: Iven Finn M.D.   On: 10/05/2021 18:56   DG CHEST PORT 1 VIEW  Result Date: 10/05/2021 CLINICAL DATA:  Respiratory failure. EXAM: PORTABLE CHEST 1 VIEW COMPARISON:  05/29/2006 FINDINGS: Low lung volumes are noted. Multiple lines and monitoring devices are seen overlying the left hemithorax which limits evaluation. The heart size and mediastinal contours are within normal limits. Both lungs are clear. IMPRESSION: No active disease. Electronically Signed   By: Marlaine Hind M.D.   On: 10/05/2021 13:05   DG Abd Portable 1V  Result Date: 10/05/2021 CLINICAL DATA:  encounter for NG tube placement, and encounter for central line placement EXAM: PORTABLE CHEST and abdomen 1 VIEW. Patient is rotated on chest. Collimation of the right abdomen and pelvis on abdominal radiograph. COMPARISON:  Chest x-ray 10/05/2021 FINDINGS: Cardiac paddles overlie the chest. Enteric tube with tip coursing below the hemidiaphragm and tip and side port overlying the gastric lumen. The heart and mediastinal contours are within normal limits. Low lung volumes. No focal consolidation. No pulmonary edema. No pleural effusion. No pneumothorax. Nonobstructive bowel gas pattern. No acute osseous abnormality. IMPRESSION: 1. Enteric tube in good position. 2. Low lung volumes with no acute cardiopulmonary abnormality. 3. Nonobstructive bowel gas pattern of the visualized upper left abdomen. Electronically Signed   By: Iven Finn M.D.   On: 10/05/2021 18:56     Assessment and Plan:   BRADYCARDIA:   Inderal is very lipophilic and has a wide distribution.   She likely took 30 pills although it could have been more.  She was appropriately managed in the ED.    It is possible for the effects to be long given the volume of distribution.  Supportive care with dopamine should be continued.  Wean as tolerated with the understanding that the patient reports a lower BP at  baseline.  I will repeat an EKG and order an echo given the potential for negative inotropic effect with toxicity.     For questions or updates, please contact [place] Please consult [email or web address] for contact info under    Signed, Minus Breeding, MD  10/08/2021 3:46 PM

## 2021-10-08 NOTE — Progress Notes (Signed)
?  Transition of Care (TOC) Screening Note ? ? ?Patient Details  ?Name: Leslie Elliott ?Date of Birth: 1976/06/08 ? ? ?Transition of Care (TOC) CM/SW Contact:    ?Benard Halsted, LCSW ?Phone Number: ?10/08/2021, 9:54 AM ? ? ? ?Transition of Care Department [facility]) has reviewed patient and will follow for medical stability for inpatient psych placement. We will continue to monitor patient advancement through interdisciplinary progression rounds. If new patient transition needs arise, please place a TOC consult. ? ? ?

## 2021-10-08 NOTE — Consult Note (Signed)
Brief Psychiatry Consult Note  ?Saw patient briefly in late afternoon.  She remained bradycardic (rate was in high 50s) and on dopamine drip -Per primary team (discussed in AM) may medically clear in next 1 to 2 days..  She was alert, oriented and denied any current suicidal ideations or hallucinations since coming to the hospital.  Not noted to be responding to internal stimuli on my exam.  She expressed understanding that the recommendation will likely be for inpatient psychiatric hospitalization.  ? ?- will see for full consult tomorrow ?- repeat EKG qtc 472 with HR 47'@16'$ :35 (after I saw) - fredercia in 490s and risk of torsades is higher in bradycardia - concerned rate may drop again as she comes off of dopamine.  ? - no need for standing medications at this time ?- remain on telemetry if receives haloperidol (PRN for agitation, hallucinations), replete Mg+ to 2 and K+ to 4 ? ?Leslie Elliott ? ?

## 2021-10-08 NOTE — Progress Notes (Signed)
? ?NAME:  Leslie Elliott, MRN:  [id number], DOB:  [birth year], LOS: 3 ?ADMISSION DATE:  10/05/2021, CONSULTATION DATE: 10/05/2021 ?REFERRING MD: Dr. Jerrol Banana, CHIEF COMPLAINT: Medication overdose ? ?History of Present Illness:  ?46 yo female developed recurrent suicidal ideation.  She overdosed on inderal, abilify, and ativan.  She had HR in 30's in ER and started on pressors.  Also started on glucagon, and insulin gtt with dextrose.  PCCM asked to admit to ICU. ? ?Pertinent  Medical History  ?Anemia, Anxiety, Depression, Suicidal ideation, Ovarian cyst ? ?Significant Hospital Events: ?Including procedures, antibiotic start and stop dates in addition to other pertinent events   ?3/10 Started on Levophed glucagon dextrose and insulin ?3/11 off insulin and dextrose ? ?Interim History / Subjective:  ?Denies chest pain, dyspnea, nausea, abdominal pain. ?Remains bradycardic, pressures are soft on dopamine 8 mcg/kg/min ?Labs reviewed  ?WBC 13.7/ HGB 13.8 /Platelets 312 ?Phos 2.8/ Mag 1.9/ 140/ K 3.8/ Cl 104/ CO2 29/ Glucose 103/ BUN 5/ Creatinine 0.82 ?Net + 4600, UO 1250 last 24 hours ? ?Objective   ?Blood pressure (!) 102/59, pulse (!) 44, temperature 98.4 ?F (36.9 ?C), temperature source Oral, resp. rate 16, height '5\' 4"'$  (1.626 m), weight 85.9 kg, SpO2 96 %. ?   ?   ? ?Intake/Output Summary (Last 24 hours) at 10/08/2021 1139 ?Last data filed at 10/08/2021 1000 ?Gross per 24 hour  ?Intake 3077.02 ml  ?Output --  ?Net 3077.02 ml  ? ?Filed Weights  ? 10/05/21 1017 10/06/21 0500  ?Weight: 71.7 kg 85.9 kg  ? ? ?Examination: ? ?General - alert, in NAD ?Eyes - PERRLA ?ENT - no sinus tenderness, no stridor, No LAD of VD ?Cardiac - S1, S2, RRR, NO RMG, bradycardia per telemetry  no murmur ?Chest - Bilateral chest excursion, Clear breath sounds,  ?Abdomen - soft, non tender, ND, + bowel sounds, Body mass index is 32.51 kg/m?. ?Extremities - No obvious deformities, no cyanosis, clubbing, or edema ?Skin - no rashes, lesions , clean dry and  intact ?Neuro - MAE x 4, A&O x 3, appropriate ?Psych - normal mood and behavior ? ?Resolved Hospital Problem list   ?Hyponatremia from dextrose infusion ? ?Assessment & Plan:  ? ?Cardiogenic shock with symptomatic bradycardia 2nd to intention overdose of inderal. ?- wean off pressors to keep HR > 50, SBP > 90 ?- continue NS IV fluids at 50 ml/hr ? ?Suicide attempt with intentional overdose. ?Hx of major depression s/p previous hospitalization and ECT. ?- sitter at bedside ?- psychiatry consulted ?- prn haldol IV until she can take pills ? ?Hypokalemia., hypophosphatemia, Hypomagnesemia. ?- f/u electrolytes after replacement ?- Will replace 1 gram mag 3/13 ? ?Leukocytosis. ?Down Trending ?- f/u CBC ?- trend fever WBC curve ? ?Best Practice (right click and "Reselect all SmartList Selections" daily)  ? ?Diet/type: advance as tolerated ?DVT prophylaxis: LMWH ?GI prophylaxis: PPI ?Lines: Central line ?Foley:  N/A ?Code Status:  full code ?Last date of multidisciplinary goals of care discussion: updated pt's sister  at bedside 3/13 2023 ? ?Labs   ? ?CMP Latest Ref Rng & Units 10/08/2021 10/07/2021 10/06/2021  ?Glucose 70 - 99 mg/dL 103(H) 146(H) 155(H)  ?BUN 6 - 20 mg/dL 5(L) <5(L) <5(L)  ?Creatinine 0.44 - 1.00 mg/dL 0.82 0.80 0.67  ?Sodium 135 - 145 mmol/L 140 138 137  ?Potassium 3.5 - 5.1 mmol/L 3.8 3.6 3.5  ?Chloride 98 - 111 mmol/L 104 106 105  ?CO2 22 - 32 mmol/L '29 24 25  '$ ?Calcium 8.9 -  10.3 mg/dL 8.4(L) 7.7(L) 7.6(L)  ?Total Protein 6.5 - 8.1 g/dL - - 5.8(L)  ?Total Bilirubin 0.3 - 1.2 mg/dL - - 0.4  ?Alkaline Phos 38 - 126 U/L - - 50  ?AST 15 - 41 U/L - - 22  ?ALT 0 - 44 U/L - - 19  ? ? ?CBC Latest Ref Rng & Units 10/08/2021 10/07/2021 10/06/2021  ?WBC 4.0 - 10.5 K/uL 13.7(H) 20.9(H) -  ?Hemoglobin 12.0 - 15.0 g/dL 13.8 13.4 11.6(L)  ?Hematocrit 36.0 - 46.0 % 38.2 36.5 34.0(L)  ?Platelets 150 - 400 K/uL 312 353 -  ? ? ?ABG ?   ?Component Value Date/Time  ? PHART 7.343 (L) 10/06/2021 0457  ? PCO2ART 32.6 10/06/2021  0457  ? PO2ART 68 (L) 10/06/2021 0457  ? HCO3 17.8 (L) 10/06/2021 0457  ? TCO2 19 (L) 10/06/2021 0457  ? ACIDBASEDEF 7.0 (H) 10/06/2021 0457  ? O2SAT 93 10/06/2021 0457  ? ? ?CBG (last 3)  ?Recent Labs  ?  10/07/21 ?0617 10/07/21 ?0704 10/07/21 ?0801  ?GLUCAP 108* 111* 123*  ? ?Critical care time: 33 minutes  ?Magdalen Spatz, MSN, AGACNP-BC ?[facility] ?See Amion for personal pager ?PCCM on call pager [phone]  ?10/08/2021, 11:39 AM ? ? ? ? ? ?

## 2021-10-08 NOTE — Progress Notes (Signed)
?   10/08/21 1350  ?Clinical Encounter Type  ?Visited With Patient  ?Visit Type Initial  ?Referral From Nurse  ?Consult/Referral To None  ? ?Chaplain responded to a spiritual consult request for prayer. Patient shared some of the events and struggles in her life that have influenced her. She is receiving care and is building a better outlook for herself. I encouraged Leslie Elliott to ask for a chaplain if she wanted to talk again we would be happy to return.  ? ?Danice Goltz ?Chaplain Resident  ?[facility]  ?

## 2021-10-09 ENCOUNTER — Other Ambulatory Visit (HOSPITAL_COMMUNITY): Payer: BC Managed Care – PPO

## 2021-10-09 DIAGNOSIS — G929 Unspecified toxic encephalopathy: Secondary | ICD-10-CM | POA: Diagnosis not present

## 2021-10-09 DIAGNOSIS — F333 Major depressive disorder, recurrent, severe with psychotic symptoms: Secondary | ICD-10-CM | POA: Diagnosis not present

## 2021-10-09 DIAGNOSIS — T447X2S Poisoning by beta-adrenoreceptor antagonists, intentional self-harm, sequela: Secondary | ICD-10-CM

## 2021-10-09 DIAGNOSIS — T50902A Poisoning by unspecified drugs, medicaments and biological substances, intentional self-harm, initial encounter: Secondary | ICD-10-CM | POA: Diagnosis not present

## 2021-10-09 LAB — CBC WITH DIFFERENTIAL/PLATELET
Abs Immature Granulocytes: 0.06 10*3/uL (ref 0.00–0.07)
Basophils Absolute: 0 10*3/uL (ref 0.0–0.1)
Basophils Relative: 0 %
Eosinophils Absolute: 0.2 10*3/uL (ref 0.0–0.5)
Eosinophils Relative: 2 %
HCT: 37.3 % (ref 36.0–46.0)
Hemoglobin: 13.3 g/dL (ref 12.0–15.0)
Immature Granulocytes: 1 %
Lymphocytes Relative: 19 %
Lymphs Abs: 2.3 10*3/uL (ref 0.7–4.0)
MCH: 30.9 pg (ref 26.0–34.0)
MCHC: 35.7 g/dL (ref 30.0–36.0)
MCV: 86.5 fL (ref 80.0–100.0)
Monocytes Absolute: 0.9 10*3/uL (ref 0.1–1.0)
Monocytes Relative: 8 %
Neutro Abs: 8.7 10*3/uL — ABNORMAL HIGH (ref 1.7–7.7)
Neutrophils Relative %: 70 %
Platelets: 297 10*3/uL (ref 150–400)
RBC: 4.31 MIL/uL (ref 3.87–5.11)
RDW: 12.5 % (ref 11.5–15.5)
WBC: 12.2 10*3/uL — ABNORMAL HIGH (ref 4.0–10.5)
nRBC: 0 % (ref 0.0–0.2)

## 2021-10-09 LAB — BASIC METABOLIC PANEL
Anion gap: 6 (ref 5–15)
BUN: 10 mg/dL (ref 6–20)
CO2: 29 mmol/L (ref 22–32)
Calcium: 8.6 mg/dL — ABNORMAL LOW (ref 8.9–10.3)
Chloride: 105 mmol/L (ref 98–111)
Creatinine, Ser: 0.84 mg/dL (ref 0.44–1.00)
GFR, Estimated: >60 mL/min (ref >60)
Glucose, Bld: 113 mg/dL — ABNORMAL HIGH (ref 70–99)
Potassium: 3.8 mmol/L (ref 3.5–5.1)
Sodium: 140 mmol/L (ref 135–145)

## 2021-10-09 LAB — LACTIC ACID, PLASMA
Lactic Acid, Venous: 2.3 mmol/L (ref 0.5–1.9)
Lactic Acid, Venous: 1.3 mmol/L (ref 0.5–1.9)

## 2021-10-09 LAB — MAGNESIUM: Magnesium: 1.9 mg/dL (ref 1.7–2.4)

## 2021-10-09 MED ORDER — SODIUM CHLORIDE 0.9 % IV SOLN: INTRAVENOUS | Status: DC | PRN

## 2021-10-09 MED ORDER — PHENTOLAMINE MESYLATE 5 MG IJ SOLR
5.0000 mg | Freq: Once | INTRAMUSCULAR | Status: AC
  Administered 2021-10-09: 5 mg via SUBCUTANEOUS
  Filled 2021-10-09 (×2): qty 5

## 2021-10-09 MED ORDER — POTASSIUM CHLORIDE CRYS ER 20 MEQ PO TBCR
40.0000 meq | EXTENDED_RELEASE_TABLET | Freq: Once | ORAL | Status: AC
Start: 2021-10-09 — End: 2021-10-09
  Administered 2021-10-09: 40 meq via ORAL
  Filled 2021-10-09: qty 2

## 2021-10-09 MED ORDER — STERILE WATER FOR INJECTION IJ SOLN
INTRAMUSCULAR | Status: AC
  Administered 2021-10-09: 1 mL
  Filled 2021-10-09: qty 10

## 2021-10-09 MED ORDER — MAGNESIUM SULFATE 2 GM/50ML IV SOLN
2.0000 g | Freq: Once | INTRAVENOUS | Status: AC
  Administered 2021-10-09: 2 g via INTRAVENOUS
  Filled 2021-10-09: qty 50

## 2021-10-09 MED ORDER — NITROGLYCERIN 2 % TD OINT
1.0000 [in_us] | TOPICAL_OINTMENT | Freq: Three times a day (TID) | TRANSDERMAL | Status: AC
  Administered 2021-10-10 (×2): 1 [in_us] via TOPICAL
  Filled 2021-10-09 (×2): qty 30

## 2021-10-09 NOTE — Progress Notes (Addendum)
After removal of the central line, patient's MAP<55 and dopamine was started at 1004. At 1016, this RN noted left forearm to be reddened with white patched tracking up the arm. Dopamine gtt stopped and 2 ml fluid was aspirated from IV, noting blood at tip of IV hub (unable to draw back more), and saline locked. Arm was elevated and warm packs were applied. Dr. Tacy Learn was on the unit and notified immediately. Extravasation protocol placed, skin marker used to note borders (3.25" w x 9" l), and Georgann Housekeeper NP at bedside to document with photo. ? ?2 peripheral IVs removed from arm, regitine injections given per protocol and nitroglycerin held r/t MAP<65, clear dressing applied to LFA IV site. ? ?Volume of dopamine received: 1.74 ml per [facility] record. ? ? ? ? ? ? ?

## 2021-10-09 NOTE — Plan of Care (Signed)
?  Problem: Education: ?Goal: Knowledge of warning signs, risks, and behaviors that relate to suicide ideation and self-harm behaviors will improve ?Outcome: Progressing ?  ?Problem: Health Behavior/Discharge (Transition) Planning: ?Goal: Ability to manage health-related needs will improve ?Outcome: Progressing ?  ?Problem: Clinical Measurements: ?Goal: Remain free from any harm during hospitalization ?Outcome: Progressing ?  ?Problem: Nutrition: ?Goal: Adequate fluids and nutrition will be maintained ?Outcome: Progressing ?  ?Problem: Coping: ?Goal: Ability to disclose and discuss thoughts of suicide and self-harm will improve ?Outcome: Progressing ?  ?Problem: Medication Management: ?Goal: Adhere to prescribed medication regimen ?Outcome: Progressing ?  ?Problem: Sleep Hygiene: ?Goal: Ability to obtain adequate restful sleep will improve ?Outcome: Progressing ?  ?

## 2021-10-09 NOTE — Progress Notes (Signed)
? ?NAME:  Leslie Elliott, MRN:  [id number], DOB:  10/20/1975, LOS: 4 ?ADMISSION DATE:  10/05/2021, CONSULTATION DATE: 10/05/2021 ?REFERRING MD: Dr. Jerrol Banana, CHIEF COMPLAINT: Medication overdose ? ?History of Present Illness:  ?46 yo female developed recurrent suicidal ideation.  She overdosed on inderal, abilify, and ativan.  She had HR in 30's in ER and started on pressors.  Also started on glucagon, and insulin gtt with dextrose.  PCCM asked to admit to ICU. ? ?Pertinent  Medical History  ?Anemia, Anxiety, Depression, Suicidal ideation, Ovarian cyst ? ?Significant Hospital Events: ?Including procedures, antibiotic start and stop dates in addition to other pertinent events   ?3/10 Started on Levophed glucagon dextrose and insulin ?3/11 off insulin and dextrose ? ?Interim History / Subjective:  ?No complaints. HR improved. BP still a bit low. Dopamine is off.  ? ?Objective   ?Blood pressure (!) 83/47, pulse 72, temperature 97.9 ?F (36.6 ?C), temperature source Oral, resp. rate 20, height '5\' 4"'$  (1.626 m), weight 85.9 kg, SpO2 100 %. ?   ?   ? ?Intake/Output Summary (Last 24 hours) at 10/09/2021 1126 ?Last data filed at 10/09/2021 1100 ?Gross per 24 hour  ?Intake 1789.1 ml  ?Output --  ?Net 1789.1 ml  ? ? ?Filed Weights  ? 10/05/21 1017 10/06/21 0500  ?Weight: 71.7 kg 85.9 kg  ? ? ?Examination: ?General:  Middle aged female in NAD ?Neuro:  Alert, oriented, non-focal.  ?HEENT:  [place]/AT, No JVD noted, PERRL ?Cardiovascular:  RRR, no MRG ?Lungs:  Clear bilateral breath sounds ?Abdomen:  Soft, non-distended, non tender ?Musculoskeletal:  No acute deformity or ROM limitation.  ?Skin:  Intact, MMM ? ?Resolved Hospital Problem list   ?Hyponatremia from dextrose infusion ? ?Assessment & Plan:  ? ?Shock secondary to bradycardia and vasoplegia in the setting of propranolol  ?- Dopamine off. BP remains low with MAP in the high 50s, but the patient is asymptomatic. HR in the 80s. DC dopamine. Can use phenylephrine peripherally if  additional BP support is required. MAP goal > 55 mm/Hg assuming no s/s of poor perfusion ?- lactic acid 2.3 will repeat in a few hours ?- continue NS IV fluids at 50 ml/hr ?- Will need to keep in ICU for now, can hopefully move out tomorrow.  ? ?Suicide attempt with intentional overdose. Propranolol, Abilify, and ativan ?Hx of major depression s/p previous hospitalization and ECT. ?- sitter at bedside ?- psychiatry consulted, full consult pending today.  ?- consider adding back benzo to avoid withdrawal. Defer to psych.  ?- prn haldol IV until she can take pills ? ?Hypokalemia., hypophosphatemia, Hypomagnesemia. ?- f/u electrolytes  ? ? ?Best Practice (right click and "Reselect all SmartList Selections" daily)  ? ?Diet/type: advance as tolerated ?DVT prophylaxis: LMWH ?GI prophylaxis: PPI ?Lines: Central line ?Foley:  N/A ?Code Status:  full code ?Last date of multidisciplinary goals of care discussion: updated pt's sister  at bedside 3/13 2023 ? ?Labs   ? ?CMP Latest Ref Rng & Units 10/09/2021 10/08/2021 10/07/2021  ?Glucose 70 - 99 mg/dL 113(H) 103(H) 146(H)  ?BUN 6 - 20 mg/dL 10 5(L) <5(L)  ?Creatinine 0.44 - 1.00 mg/dL 0.84 0.82 0.80  ?Sodium 135 - 145 mmol/L 140 140 138  ?Potassium 3.5 - 5.1 mmol/L 3.8 3.8 3.6  ?Chloride 98 - 111 mmol/L 105 104 106  ?CO2 22 - 32 mmol/L '29 29 24  '$ ?Calcium 8.9 - 10.3 mg/dL 8.6(L) 8.4(L) 7.7(L)  ?Total Protein 6.5 - 8.1 g/dL - - -  ?Total Bilirubin 0.3 -  1.2 mg/dL - - -  ?Alkaline Phos 38 - 126 U/L - - -  ?AST 15 - 41 U/L - - -  ?ALT 0 - 44 U/L - - -  ? ? ?CBC Latest Ref Rng & Units 10/09/2021 10/08/2021 10/07/2021  ?WBC 4.0 - 10.5 K/uL 12.2(H) 13.7(H) 20.9(H)  ?Hemoglobin 12.0 - 15.0 g/dL 13.3 13.8 13.4  ?Hematocrit 36.0 - 46.0 % 37.3 38.2 36.5  ?Platelets 150 - 400 K/uL 297 312 353  ? ? ?ABG ?   ?Component Value Date/Time  ? PHART 7.343 (L) 10/06/2021 0457  ? PCO2ART 32.6 10/06/2021 0457  ? PO2ART 68 (L) 10/06/2021 0457  ? HCO3 17.8 (L) 10/06/2021 0457  ? TCO2 19 (L) 10/06/2021 0457   ? ACIDBASEDEF 7.0 (H) 10/06/2021 0457  ? O2SAT 93 10/06/2021 0457  ? ? ?CBG (last 3)  ?Recent Labs  ?  10/07/21 ?0617 10/07/21 ?0704 10/07/21 ?0801  ?GLUCAP 108* 111* 123*  ? ? ?Critical care time:  ? ? ?Georgann Housekeeper, AGACNP-BC ? [facility] ? ?See Amion for personal pager ?PCCM on call pager [phone] until 7pm. ?Please call Elink 7p-7a. [phone] ? ?10/09/2021 12:38 PM ? ? ? ? ? ? ? ?

## 2021-10-09 NOTE — Progress Notes (Signed)
? ?Progress Note ? ?Patient Name: Leslie Elliott ?Date of Encounter: 10/09/2021 ? ?Primary Cardiologist:   None ? ? ?Subjective  ? ?She is feeling OK.  She was up to a chair and to the bathroom.  She is now intermittently tachycardic but is not feeling any light headedness or presyncope.  No pain or SOB.  ? ?Inpatient Medications  ?  ?Scheduled Meds: ? Chlorhexidine Gluconate Cloth  6 each Topical Daily  ? enoxaparin (LOVENOX) injection  40 mg Subcutaneous Q24H  ? feeding supplement  1 Container Oral TID BM  ? ?Continuous Infusions: ? DOPamine 10 mcg/kg/min (10/09/21 0700)  ? ?PRN Meds: ?acetaminophen, docusate sodium, haloperidol, polyethylene glycol, prochlorperazine  ? ?Vital Signs  ?  ?Vitals:  ? 10/09/21 0500 10/09/21 0530 10/09/21 0600 10/09/21 0700  ?BP: (!) 106/50 (!) 96/48 (!) 107/56 (!) 106/47  ?Pulse: (!) 44 (!) 44 76 73  ?Resp: '15 14 10 '$ (!) 25  ?Temp:      ?TempSrc:      ?SpO2: 97% 95% 96% 97%  ?Weight:      ?Height:      ? ? ?Intake/Output Summary (Last 24 hours) at 10/09/2021 0724 ?Last data filed at 10/09/2021 0700 ?Gross per 24 hour  ?Intake 2057.19 ml  ?Output --  ?Net 2057.19 ml  ? ?Filed Weights  ? 10/05/21 1017 10/06/21 0500  ?Weight: 71.7 kg 85.9 kg  ? ? ?Telemetry  ?  ?NSR, bradycardia resolved - Personally Reviewed ? ?ECG  ?  ?NA - Personally Reviewed ? ?Physical Exam  ? ?GEN: No acute distress.   ?Neck: No  JVD ?Cardiac: RRR, no murmurs, rubs, or gallops.  ?Respiratory: Clear  to auscultation bilaterally. ?GI: Soft, nontender, non-distended  ?MS: No  edema; No deformity. ?Neuro:  Nonfocal  ?Psych: Normal affect  ? ?Labs  ?  ?Chemistry ?Recent Labs  ?Lab 10/06/21 ?2122 10/06/21 ?4825 10/06/21 ?1409 10/06/21 ?1743 10/06/21 ?2301 10/07/21 ?0435 10/08/21 ?0037 10/09/21 ?0488  ?NA 119*  --  125*  --  137 138 140 140  ?K 2.7*   < > 2.8*   < > 3.5 3.6 3.8 3.8  ?CL 91*  --  93*  --  105 106 104 105  ?CO2 21*  --  23  --  '25 24 29 29  '$ ?GLUCOSE 269*  --  128*  --  155* 146* 103* 113*  ?BUN <5*  --  <5*   --  <5* <5* 5* 10  ?CREATININE 0.56  --  0.47  --  0.67 0.80 0.82 0.84  ?CALCIUM 6.4*  --  7.3*  --  7.6* 7.7* 8.4* 8.6*  ?PROT 5.2*  --  5.8*  --  5.8*  --   --   --   ?ALBUMIN 3.0*  --  3.3*  --  3.0*  --   --   --   ?AST 16  --  17  --  22  --   --   --   ?ALT 13  --  16  --  19  --   --   --   ?ALKPHOS 41  --  48  --  50  --   --   --   ?BILITOT 0.5  --  0.4  --  0.4  --   --   --   ?GFRNONAA >60  --  >60  --  >60 >60 >60 >60  ?ANIONGAP 7  --  9  --  '7 8 7 6  '$ ? < > =  values in this interval not displayed.  ?  ? ?Hematology ?Recent Labs  ?Lab 10/07/21 ?0435 10/08/21 ?6222 10/09/21 ?9798  ?WBC 20.9* 13.7* 12.2*  ?RBC 4.36 4.39 4.31  ?HGB 13.4 13.8 13.3  ?HCT 36.5 38.2 37.3  ?MCV 83.7 87.0 86.5  ?MCH 30.7 31.4 30.9  ?MCHC 36.7* 36.1* 35.7  ?RDW 12.3 12.5 12.5  ?PLT 353 312 297  ? ? ?Cardiac EnzymesNo results for input(s): TROPONINI in the last 168 hours. No results for input(s): TROPIPOC in the last 168 hours.  ? ?BNPNo results for input(s): BNP, PROBNP in the last 168 hours.  ? ?DDimer No results for input(s): DDIMER in the last 168 hours.  ? ?Radiology  ?  ?No results found. ? ?Cardiac Studies  ? ?NA ? ?Patient Profile  ?   ?46 y.o. female with no prior cardiac history who is being seen 10/08/2021 for the evaluation of bradycardia at the request of Dr. Tacy Learn. ?  ? ?Assessment & Plan  ?  ?Bradycardia:  Resolved.  Still with low BP but would wean dopamine for any BPs above 90 systolic.      No further imaging or testing.  We will follow as needed.   ? ?For questions or updates, please contact [place] ?Please consult [email or web address] for contact info under Cardiology/STEMI. ?  ?Signed, ?Minus Breeding, MD  ?10/09/2021, 7:24 AM   ? ?

## 2021-10-09 NOTE — Progress Notes (Signed)
Clarified BP parameters with Dr. Tacy Learn. Titrate dopamine gtt for Map>55 as long as the patient is asymptomatic. Will repeat lactate when gtt is off for 2 hrs with no symptoms. ?

## 2021-10-09 NOTE — Progress Notes (Addendum)
Pharmacy Electrolyte Replacement ? ?Recent Labs: ? ?Recent Labs  ?  10/08/21 ?0223 10/09/21 ?3612  ?K 3.8 3.8  ?MG 1.9 1.9  ?PHOS 2.8  --   ?CREATININE 0.82 0.84  ? ? ?Low Critical Values (K </= 2.5, Phos </= 1, Mg </= 1) Present: ?None ? ?MD Contacted: none - no critical values noted ? ?Plan: Mag sulfate 2g IV x 1 ?KCl 27mq PO x 1 to keep K >/=4 per Psych recommendations ?Repeat levels with AM labs ? ? ?HArturo Morton PharmD, BCPS ?Please check AMION for all [place]contact numbers ?Clinical Pharmacist ?10/09/2021 11:29 AM ? ?

## 2021-10-09 NOTE — Progress Notes (Addendum)
PCCM INTERVAL PROGRESS NOTE ? ? ?Patient has been off dopamine since first thing this morning. She was been hypotensive, but asymptomatic. Now she has MAP 65 on last few readings. Lactic acid has cleared. I have discussed with Dr. Tacy Learn. Stable for transfer to medical ward. I have also discussed her case with psych. They are planning to see her tomorrow with the presumption she will be medically cleared to inpatient psychiatric facility.  ? ? ?Georgann Housekeeper, AGACNP-BC ?[facility] ? ?See Amion for personal pager ?PCCM on call pager [phone] until 7pm. ?Please call Elink 7p-7a. [phone] ? ?10/09/2021 6:05 PM ? ? ? ? ?

## 2021-10-09 NOTE — Discharge Summary (Addendum)
Physician Discharge Summary  ? ? ? ? ? ?Patient ID: ?Leslie Elliott ?MRN: [id number] ?DOB/AGE: 27-Jan-1976 46 y.o. ? ?Admit date: 10/05/2021 ?Discharge date: 10/11/2021 ? ?Discharge Diagnoses:  ?Principal Problem: ?  Drug overdose ?Active Problems: ?  MDD (major depressive disorder), recurrent, severe, with psychosis ([place]) ? ? ?History of Present Illness: ?46 year old female with history of MDD with psychotic features, prior suicidal ideation, and is under psychiatric care. Initially did well after ECT therapy done in 2007 but recently developed more suicidal ideation and became less interactive with environment. On 3/10 she unfortunately overdosed on inderal, abilify, and ativan. Reportedly took the remainder of all three bottles, but the number of actual pills was unknown. Upon EMS arrival she was found to have BP 60-40 and HR in the 30s. EMS treated with glucagon and epinephrine infusion. Upon arrival to the ED she was transitioned to norepi and started on high dose insulin. Poison control was contacted and made appropriate recommendations. She required norepi to keep heart rate out of the 30s. PCCM was asked to admit. ? ?Hospital Course:  ?Eventually she was able to be transitioned off insulin infusion. Norepi changed to dopamine. Bradycardia and hypotension lingered through 3/14. Dopamine discontinued. She remained somewhat hypotensive throughout the morning hours of 3/14 but HR had improved into the 90s. No symptoms of hypotension. Lactic acid cleared to 1.3 and MAPs improved to ~ 65 mmHg. She was transferred out of the ICU 3/14 and received ongoing medical management until being medically cleared for discharge to inpatient psychiatric facility 10/11/21.  ? ? ? Discharge Plan by active problems  ? ?Suicide attempt with intentional overdose. Propranolol, Abilify, and ativan ?MDD with psychotic features ?- Discharge to inpatient psychiatric facility ?- still holding home meds at time of discharge  ? ?Resolved  diagnoses ?- Shock: cardiogenic +/- distributive ?- Hypokalemia ?- Hypophosphatemia ?- Hypomagnesemia ? ? ?Significant Hospital tests/ studies  ?N/a ?Consults  ?Cardiology ?Psychiatry ? ?Discharge Exam: ?BP (!) 105/58 (BP Location: Right Arm)   Pulse 60   Temp 97.6 ?F (36.4 ?C) (Oral)   Resp 14   Ht '5\' 4"'$  (1.626 m)   Wt 75.5 kg   SpO2 97%   BMI 28.57 kg/m?  ? ? ?General: Middle aged F NAD  ?Neuro:awake alert oriented x3  ?Psych: Fairly flat affect. No SI/HI/VH/AH ?HEENT: NCAT pink mm ?Pulm: even unlabored on RA ?CV:rrr ?GG:YIRS ndnt  ?GU: defer ?MSK: no obvious acute joint deformity  ? ?Labs at discharge ?Lab Results  ?Component Value Date  ? CREATININE 0.84 10/10/2021  ? BUN 19 10/10/2021  ? NA 139 10/10/2021  ? K 4.1 10/10/2021  ? CL 105 10/10/2021  ? CO2 23 10/10/2021  ? ?Lab Results  ?Component Value Date  ? WBC 12.2 (H) 10/09/2021  ? HGB 13.3 10/09/2021  ? HCT 37.3 10/09/2021  ? MCV 86.5 10/09/2021  ? PLT 297 10/09/2021  ? ?Lab Results  ?Component Value Date  ? ALT 19 10/06/2021  ? AST 22 10/06/2021  ? ALKPHOS 50 10/06/2021  ? BILITOT 0.4 10/06/2021  ? ?Lab Results  ?Component Value Date  ? INR 1.2 10/05/2021  ? ? ?Current radiology studies ?No results found. ? ?Disposition: Will be admitted for inpatient psychiatric care  ?  ? ? ?Allergies as of 10/11/2021   ? ?   Reactions  ? Zofran [ondansetron Hcl] Rash  ? ?  ? ?  ?Medication List  ?  ? ?STOP taking these medications   ? ?ARIPiprazole 5 MG tablet ?  Commonly known as: ABILIFY ?  ?buPROPion 150 MG 12 hr tablet ?Commonly known as: WELLBUTRIN SR ?  ?mirtazapine 15 MG tablet ?Commonly known as: REMERON ?  ?venlafaxine XR 37.5 MG 24 hr capsule ?Commonly known as: EFFEXOR-XR ?  ?vitamin B-12 1000 MCG tablet ?Commonly known as: CYANOCOBALAMIN ?  ? ?  ? ?TAKE these medications   ? ?haloperidol 2 MG tablet ?Commonly known as: HALDOL ?Take 1 tablet (2 mg total) by mouth every 8 (eight) hours as needed for agitation (severe agitation - hold for QTc >500). ?  ? ?   ? ? ? ?Discharged Condition: stable ? ?>30 minutes of time have been dedicated to discharge assessment, planning and discharge instructions.  ? ?Signed: ? ?Eliseo Gum MSN, AGACNP-BC ?[facility] ?Amion for pager ?10/11/2021, 11:04 AM ? ? ? ? ? ? ? ? ? ?

## 2021-10-10 DIAGNOSIS — T50902A Poisoning by unspecified drugs, medicaments and biological substances, intentional self-harm, initial encounter: Secondary | ICD-10-CM | POA: Diagnosis not present

## 2021-10-10 DIAGNOSIS — F333 Major depressive disorder, recurrent, severe with psychotic symptoms: Secondary | ICD-10-CM | POA: Diagnosis not present

## 2021-10-10 LAB — BASIC METABOLIC PANEL
Anion gap: 11 (ref 5–15)
BUN: 19 mg/dL (ref 6–20)
CO2: 23 mmol/L (ref 22–32)
Calcium: 8.4 mg/dL — ABNORMAL LOW (ref 8.9–10.3)
Chloride: 105 mmol/L (ref 98–111)
Creatinine, Ser: 0.84 mg/dL (ref 0.44–1.00)
GFR, Estimated: >60 mL/min (ref >60)
Glucose, Bld: 93 mg/dL (ref 70–99)
Potassium: 4.1 mmol/L (ref 3.5–5.1)
Sodium: 139 mmol/L (ref 135–145)

## 2021-10-10 LAB — MAGNESIUM: Magnesium: 2 mg/dL (ref 1.7–2.4)

## 2021-10-10 NOTE — Plan of Care (Signed)
Pt alert and oriented x 4. Pt bp improving. Pt denies HI/SI ?Problem: Education: ?Goal: Knowledge of General Education information will improve ?Description: Including pain rating scale, medication(s)/side effects and non-pharmacologic comfort measures ?Outcome: Progressing ?  ?Problem: Health Behavior/Discharge Planning: ?Goal: Ability to manage health-related needs will improve ?Outcome: Progressing ?  ?Problem: Clinical Measurements: ?Goal: Ability to maintain clinical measurements within normal limits will improve ?Outcome: Progressing ?Goal: Will remain free from infection ?Outcome: Progressing ?Goal: Diagnostic test results will improve ?Outcome: Progressing ?Goal: Respiratory complications will improve ?Outcome: Progressing ?Goal: Cardiovascular complication will be avoided ?Outcome: Progressing ?  ?Problem: Activity: ?Goal: Risk for activity intolerance will decrease ?Outcome: Progressing ?  ?Problem: Nutrition: ?Goal: Adequate nutrition will be maintained ?Outcome: Progressing ?  ?Problem: Coping: ?Goal: Level of anxiety will decrease ?Outcome: Progressing ?  ?Problem: Elimination: ?Goal: Will not experience complications related to bowel motility ?Outcome: Progressing ?Goal: Will not experience complications related to urinary retention ?Outcome: Progressing ?  ?Problem: Pain Managment: ?Goal: General experience of comfort will improve ?Outcome: Progressing ?  ?Problem: Safety: ?Goal: Ability to remain free from injury will improve ?Outcome: Progressing ?  ?Problem: Skin Integrity: ?Goal: Risk for impaired skin integrity will decrease ?Outcome: Progressing ?  ?Problem: Education: ?Goal: Knowledge of warning signs, risks, and behaviors that relate to suicide ideation and self-harm behaviors will improve ?Outcome: Progressing ?  ?Problem: Health Behavior/Discharge (Transition) Planning: ?Goal: Ability to manage health-related needs will improve ?Outcome: Progressing ?  ?Problem: Clinical Measurements: ?Goal:  Remain free from any harm during hospitalization ?Outcome: Progressing ?  ?Problem: Nutrition: ?Goal: Adequate fluids and nutrition will be maintained ?Outcome: Progressing ?  ?Problem: Coping: ?Goal: Ability to disclose and discuss thoughts of suicide and self-harm will improve ?Outcome: Progressing ?  ?Problem: Medication Management: ?Goal: Adhere to prescribed medication regimen ?Outcome: Progressing ?  ?Problem: Sleep Hygiene: ?Goal: Ability to obtain adequate restful sleep will improve ?Outcome: Progressing ?  ?Problem: Self Esteem: ?Goal: Ability to verbalize positive feeling about self will improve ?Outcome: Progressing ?  ?

## 2021-10-10 NOTE — Progress Notes (Signed)
? ? ?  [facility] will sign off.  (Note:  EKG 10/09/21 reviewed.  Borderline QTC prolongation.)  ?Medication Recommendations:  No change from previous.  Prescribing provider should be aware to follow QTc with any further/change in QT prolonging drugs. ) ?Other recommendations (labs, testing, etc):  Call with further questions ?Follow up as an outpatient:  With PCP.   ? ?

## 2021-10-10 NOTE — Progress Notes (Signed)
? ?NAME:  Leslie Elliott, MRN:  [id number], DOB:  1976-06-22, LOS: 5 ?ADMISSION DATE:  10/05/2021, CONSULTATION DATE: 10/05/2021 ?REFERRING MD: Dr. Jerrol Banana, CHIEF COMPLAINT: Medication overdose ? ?History of Present Illness:  ?46 yo female developed recurrent suicidal ideation.  She overdosed on inderal, abilify, and ativan.  She had HR in 30's in ER and started on pressors.  Also started on glucagon, and insulin gtt with dextrose.  PCCM asked to admit to ICU. ? ?Pertinent  Medical History  ?Anemia, Anxiety, Depression, Suicidal ideation, Ovarian cyst ? ?Significant Hospital Events: ?Including procedures, antibiotic start and stop dates in addition to other pertinent events   ?3/10 Started on Levophed glucagon dextrose and insulin ?3/11 off insulin and dextrose ?3/14 dc dopamine, moved out of ICU  ? ?Interim History / Subjective:  ?Moved out of ICU 3/14  ? ?Objective   ?Blood pressure (!) 103/49, pulse (!) 58, temperature 97.8 ?F (36.6 ?C), temperature source Axillary, resp. rate 20, height '5\' 4"'$  (1.626 m), weight 74.1 kg, SpO2 96 %. ?   ?   ? ?Intake/Output Summary (Last 24 hours) at 10/10/2021 0921 ?Last data filed at 10/10/2021 0730 ?Gross per 24 hour  ?Intake 923.31 ml  ?Output --  ?Net 923.31 ml  ? ?Filed Weights  ? 10/05/21 1017 10/06/21 0500 10/10/21 0500  ?Weight: 71.7 kg 85.9 kg 74.1 kg  ? ?Examination: ?General:  WDWN middle aged F seated NAD ?Neuro:  AAOx4 following commands ?HEENT:  NCAT pink mm anicteric sclera ?Cardiovascular:  rrr ?Lungs:  even unlabored on RA  ?Abdomen:  ndnt ?Musculoskeletal:  no acute joint deformity  ?Skin: c/d/w no rash  ? ?Resolved Hospital Problem list   ?Hyponatremia from dextrose infusion ?Shock  ? ?Assessment & Plan:  ? ?MDD with suicidal ideation and intentional overdose: abilify, propranolol, effexor, ativan  ?-anticipate dc to inpt psych after eval by psych  ? ?Shock, improved  ?-was related to OD. Required dobutamine. BP now at  baseline ? ?Hypokalemia ?Hypophosphatemia ?Hypomagnesemia  ?P ?-replace lytes PRN ? ? ? ?Best Practice (right click and "Reselect all SmartList Selections" daily)  ? ?Diet/type: advance as tolerated ?DVT prophylaxis: LMWH ?GI prophylaxis: PPI ?Lines: Central line ?Foley:  N/A ?Code Status:  full code ?Last date of multidisciplinary goals of care discussion: updated pt's sister  at bedside 3/15 2023 ? ?Labs   ? ?CMP Latest Ref Rng & Units 10/10/2021 10/09/2021 10/08/2021  ?Glucose 70 - 99 mg/dL 93 113(H) 103(H)  ?BUN 6 - 20 mg/dL 19 10 5(L)  ?Creatinine 0.44 - 1.00 mg/dL 0.84 0.84 0.82  ?Sodium 135 - 145 mmol/L 139 140 140  ?Potassium 3.5 - 5.1 mmol/L 4.1 3.8 3.8  ?Chloride 98 - 111 mmol/L 105 105 104  ?CO2 22 - 32 mmol/L '23 29 29  '$ ?Calcium 8.9 - 10.3 mg/dL 8.4(L) 8.6(L) 8.4(L)  ?Total Protein 6.5 - 8.1 g/dL - - -  ?Total Bilirubin 0.3 - 1.2 mg/dL - - -  ?Alkaline Phos 38 - 126 U/L - - -  ?AST 15 - 41 U/L - - -  ?ALT 0 - 44 U/L - - -  ? ? ?CBC Latest Ref Rng & Units 10/09/2021 10/08/2021 10/07/2021  ?WBC 4.0 - 10.5 K/uL 12.2(H) 13.7(H) 20.9(H)  ?Hemoglobin 12.0 - 15.0 g/dL 13.3 13.8 13.4  ?Hematocrit 36.0 - 46.0 % 37.3 38.2 36.5  ?Platelets 150 - 400 K/uL 297 312 353  ? ? ?ABG ?   ?Component Value Date/Time  ? PHART 7.343 (L) 10/06/2021 0457  ? PCO2ART  32.6 10/06/2021 0457  ? PO2ART 68 (L) 10/06/2021 0457  ? HCO3 17.8 (L) 10/06/2021 0457  ? TCO2 19 (L) 10/06/2021 0457  ? ACIDBASEDEF 7.0 (H) 10/06/2021 0457  ? O2SAT 93 10/06/2021 0457  ? ? ?CBG (last 3)  ?No results for input(s): GLUCAP in the last 72 hours. ?Critical care time: n/a  ? ?Eliseo Gum MSN, AGACNP-BC ?[facility] ?Amion for pager  ?10/10/2021, 9:21 AM ? ? ? ? ? ? ? ? ?

## 2021-10-10 NOTE — Progress Notes (Signed)
[facility] aware of referral, no beds available as of yet. ? ?Cedric Fishman ?LCSW, MSW, MHA ? ?

## 2021-10-10 NOTE — Consult Note (Signed)
[facility] Followup Face-to-Face Psychiatric Evaluation ? ? ?Service Date: October 10, 2021 ?LOS:  LOS: 5 days  ? ? ?Assessment  ?Leslie Elliott is a 46 y.o. female admitted medically for 10/05/2021  9:52 AM for Suicide attempt. She carries the psychiatric diagnoses of depression and anxiety and has a past medical history of ovarian cyst .Psychiatry was consulted for suicide attempt by  Jacky Kindle, MD.  ? ? ?Her initial presentation of  is most consistent with psychosis. She meets criteria for acute psychosis based on endorsing AH for a few day.  Current outpatient psychotropic medications include Abilify '5mg'$ , Wellbutrin XL '300mg'$  and historically she has had a decent response to these medications, with previous periods of stability on Abilify. She was partially compliant (has had periods off of compliance with suddenly discontinuation) with medications prior to admission as evidenced by patient and husband in room. On initial examination, patient endorsed that she had been having AH and endorsed that she had been feeling more depressed. Please see plan below for detailed recommendations.  ? ?Patient does need inpatient psych hospitalization for med mgmt and stabilization. Patient is currently unmediated and has a hx of AVH and bizarre behaviors without medication. Patient appears less paranoid but continues to have flat affect and is at increased risk for reemergence of symptoms.  ? ?Diagnoses:  ?Active Hospital problems: ?Principal Problem: ?  Drug overdose ?Active Problems: ?  MDD (major depressive disorder), recurrent, severe, with psychosis ([place]) ?  ? ? ?Plan  ?## Safety and Observation Level:  ?- Based on my clinical evaluation, I estimate the patient to be at mild to moderate risk of self harm in the current setting ?- At this time, we recommend a routine level of observation. This decision is based on my review of the chart including patient's history and current presentation, interview of  the patient, mental status examination, and consideration of suicide risk including evaluating suicidal ideation, plan, intent, suicidal or self-harm behaviors, risk factors, and protective factors. This judgment is based on our ability to directly address suicide risk, implement suicide prevention strategies and develop a safety plan while the patient is in the clinical setting. Please contact our team if there is a concern that risk level has changed. ? ? ?## Medications:  ?-- Continue to hold home Abilify and Wellbutrin XL ? ?## Medical Decision Making Capacity:  ?-- Not formally assessed ? ?## Further Work-up:  ?-- Recommend TSH, Lipid, A1c ? ?-- most recent EKG on 3/14 had QtC of 413 ?-- Pertinent labwork reviewed earlier this admission includes:  ? ?## Disposition:  ?-- Inpatient psych hospitalization ? ?## Behavioral / Environmental:  ?-- Reorient verbally ? ?##Legal Status ?-- IVC ? ?Thank you for this consult request. Recommendations have been communicated to the primary team.  We will continue to follow at this time.  ? ? ?PGY-2 ?Freida Busman, MD ? ? ?Followup history  ?Relevant Aspects of Hospital Course:  ?Admitted on 10/05/2021 for overdose. ? ?Patient Report:  ?On assessment this AM patient reports that she is aware that she is being recommended for inpatient psych. Patient reports that she had been having AVH for about 1 week prior to her overdose. Patient reports that she had been afraid to tell her psychiatrist out of fear of having to be rehospitalized. Patient reports that she was compliant with her Abilify and Wellbutrin but had initially been taking 2.'5mg'$  until that week when it was increased to '5mg'$  and she also started  [place].  ? ?Patient reports that since the OD she has not had AH and denies racing thoughts or loud thoughts. Patient reports she is sleeping ok and her appetite is "fine." Patient reports that her mood is "good" today. Patient denies current SI, HI and AVH.  ? ? ?Collateral  information:  ? ?3/15 - Patient's husband was in the room during assessment, and he was quiet for the first half of the assessment but interjected when patient became a bit confused about her medication regimen change timelines over the past few months. Husband showed pictures of patient's prescription bottles and endorsed that he had noticed that patient was "sliding" approx 2 Friday's ago. Husband reported that he will usually noticed when patient starts to go downhill and reach out to the psychiatrist. Husband reports that his concerns this most recent episode prompted patient to be started on Effexor 37.5 2 weeks ago and was increased to '75mg'$  4 days prior to OD.  Husband also endorsed that patient was not taking Abilify most of last year and was not very compliant the last 2 month s of 2022 and that there was concern for her mental health. Husband reports that patient will usually have episodes of AVH when she is not "medicated." Husband reports that when he started becoming concerning approx 2 weeks ago he began moving the kitchen knives and also administering patient medications to her to make sure she was compliant.  ? ?Psychiatric History:  ?Prior Inpatient Therapy:  [facility] 2007 with ECT, 2022- discharged on Abilify 5 mg and Wellbutrin XL 300 mg ?Prior Outpatient Therapy:  Dr Latricia Heft ? ? ?Social History:  ?married ? ?Tobacco use: Defer ?Alcohol use: Defer ?Drug use: Defer ? ?Family History:  ?daughter with depression and anxiety ?The patient's family history is not on file. ? ?Medical History: ?Past Medical History:  ?Diagnosis Date  ? Anemia   ? Anxiety   ? Depression   ? Ovarian cyst   ? ? ?Surgical History: ?Past Surgical History:  ?Procedure Laterality Date  ? ECT TREATMENTS    ? ROBOTIC ASSISTED BILATERAL SALPINGO OOPHERECTOMY Left 10/30/2020  ? Procedure: XI ROBOTIC ASSISTED SALPINGO OOPHORECTOMY;  Surgeon: Benjaman Kindler, MD;  Location: [facility] ORS;  Service: Gynecology;  Laterality: Left;  ? ROBOTIC ASSISTED  LAPAROSCOPIC OVARIAN CYSTECTOMY Bilateral 10/30/2020  ? Procedure: XI ROBOTIC ASSISTED LAPAROSCOPIC OVARIAN CYSTECTOMY;  Surgeon: Benjaman Kindler, MD;  Location: [facility] ORS;  Service: Gynecology;  Laterality: Bilateral;  ? TUBAL LIGATION  2002  ? XI ROBOTIC ASSISTED SALPINGECTOMY Right 10/30/2020  ? Procedure: XI ROBOTIC ASSISTED SALPINGECTOMY;  Surgeon: Benjaman Kindler, MD;  Location: [facility] ORS;  Service: Gynecology;  Laterality: Right;  ? ? ?Medications:  ? ?Current Facility-Administered Medications:  ?  0.9 %  sodium chloride infusion, , Intravenous, PRN, Jacky Kindle, MD, Stopped at 10/09/21 1557 ?  acetaminophen (TYLENOL) tablet 650 mg, 650 mg, Oral, Q6H PRN, Chesley Mires, MD, 650 mg at 10/08/21 0800 ?  Chlorhexidine Gluconate Cloth 2 % PADS 6 each, 6 each, Topical, Daily, Elsie Lincoln, MD, 6 each at 10/10/21 0855 ?  docusate sodium (COLACE) capsule 100 mg, 100 mg, Oral, BID PRN, Minor, Grace Bushy, NP ?  enoxaparin (LOVENOX) injection 40 mg, 40 mg, Subcutaneous, Q24H, Sood, Vineet, MD, 40 mg at 10/10/21 0854 ?  feeding supplement (BOOST / RESOURCE BREEZE) liquid 1 Container, 1 Container, Oral, TID BM, Chesley Mires, MD, 1 Container at 10/10/21 0855 ?  haloperidol (HALDOL) tablet 2 mg, 2 mg, Oral, Q8H PRN, Magdalen Spatz,  NP ?  nitroGLYCERIN (NITROGLYN) 2 % ointment 1 inch, 1 inch, Topical, Q8H, Chand, Sudham, MD ?  polyethylene glycol (MIRALAX / GLYCOLAX) packet 17 g, 17 g, Oral, Daily PRN, Minor, Grace Bushy, NP ?  prochlorperazine (COMPAZINE) injection 10 mg, 10 mg, Intravenous, Q4H PRN, Chesley Mires, MD ? ?Allergies: ?Allergies  ?Allergen Reactions  ? Zofran [Ondansetron Hcl] Rash  ? ? ? ?  ?Objective  ?Vital signs:  ?Temp:  [97.6 ?F (36.4 ?C)-98.4 ?F (36.9 ?C)] 97.6 ?F (36.4 ?C) (03/15 1000) ?Pulse Rate:  [51-101] 76 (03/15 1000) ?Resp:  [14-25] 16 (03/15 1000) ?BP: (72-108)/(42-63) 99/57 (03/15 1000) ?SpO2:  [90 %-100 %] 98 % (03/15 1000) ?Weight:  [74.1 kg] 74.1 kg (03/15 0500) ? ?Psychiatric Specialty  Exam: ? ?Presentation  ?General Appearance: Appropriate for Environment; Casual ? ?Eye Contact:Fair ? ?Speech:Clear and Coherent ? ?Speech Volume:Normal ? ?Handedness:Right ? ? ?Mood and Affect  ?Mood:Euthymic ? ?Af

## 2021-10-11 ENCOUNTER — Other Ambulatory Visit: Payer: Self-pay

## 2021-10-11 ENCOUNTER — Inpatient Hospital Stay
Admission: AD | Admit: 2021-10-11 | Discharge: 2021-10-15 | DRG: 885 | Disposition: A | Discharge status: Home / Self Care | Payer: BC Managed Care – PPO | Source: Intra-hospital | Attending: Psychiatry | Admitting: Psychiatry

## 2021-10-11 ENCOUNTER — Encounter: Payer: Self-pay | Admitting: Psychiatry

## 2021-10-11 DIAGNOSIS — T43504A Poisoning by unspecified antipsychotics and neuroleptics, undetermined, initial encounter: Secondary | ICD-10-CM | POA: Diagnosis present

## 2021-10-11 DIAGNOSIS — F332 Major depressive disorder, recurrent severe without psychotic features: Principal | ICD-10-CM | POA: Diagnosis present

## 2021-10-11 DIAGNOSIS — F419 Anxiety disorder, unspecified: Secondary | ICD-10-CM | POA: Diagnosis present

## 2021-10-11 DIAGNOSIS — Z9151 Personal history of suicidal behavior: Secondary | ICD-10-CM

## 2021-10-11 DIAGNOSIS — G4700 Insomnia, unspecified: Secondary | ICD-10-CM | POA: Diagnosis present

## 2021-10-11 DIAGNOSIS — D649 Anemia, unspecified: Secondary | ICD-10-CM | POA: Diagnosis present

## 2021-10-11 DIAGNOSIS — Z20822 Contact with and (suspected) exposure to covid-19: Secondary | ICD-10-CM | POA: Diagnosis present

## 2021-10-11 DIAGNOSIS — Z9851 Tubal ligation status: Secondary | ICD-10-CM

## 2021-10-11 DIAGNOSIS — R45851 Suicidal ideations: Secondary | ICD-10-CM | POA: Diagnosis present

## 2021-10-11 DIAGNOSIS — F3160 Bipolar disorder, current episode mixed, unspecified: Secondary | ICD-10-CM | POA: Diagnosis present

## 2021-10-11 DIAGNOSIS — Z888 Allergy status to other drugs, medicaments and biological substances status: Secondary | ICD-10-CM

## 2021-10-11 DIAGNOSIS — T1491XA Suicide attempt, initial encounter: Secondary | ICD-10-CM | POA: Diagnosis present

## 2021-10-11 LAB — RESP PANEL BY RT-PCR (FLU A&B, COVID) ARPGX2
Influenza A by PCR: NEGATIVE
Influenza B by PCR: NEGATIVE
SARS Coronavirus 2 by RT PCR: NEGATIVE

## 2021-10-11 MED ORDER — HYDROXYZINE HCL 50 MG PO TABS
50.0000 mg | ORAL_TABLET | Freq: Three times a day (TID) | ORAL | Status: DC | PRN
  Administered 2021-10-11: 50 mg via ORAL
  Filled 2021-10-11: qty 1

## 2021-10-11 MED ORDER — ALUM & MAG HYDROXIDE-SIMETH 200-200-20 MG/5ML PO SUSP: 30.0000 mL | ORAL | Status: DC | PRN

## 2021-10-11 MED ORDER — HALOPERIDOL 2 MG PO TABS: 2.0000 mg | ORAL_TABLET | Freq: Three times a day (TID) | ORAL | Status: DC | PRN

## 2021-10-11 MED ORDER — ACETAMINOPHEN 325 MG PO TABS
650.0000 mg | ORAL_TABLET | Freq: Four times a day (QID) | ORAL | Status: DC | PRN
  Administered 2021-10-13 – 2021-10-14 (×2): 650 mg via ORAL
  Filled 2021-10-11 (×2): qty 2

## 2021-10-11 MED ORDER — TRAZODONE HCL 100 MG PO TABS
100.0000 mg | ORAL_TABLET | Freq: Every evening | ORAL | Status: DC | PRN
  Administered 2021-10-11 – 2021-10-14 (×4): 100 mg via ORAL
  Filled 2021-10-11 (×4): qty 1

## 2021-10-11 MED ORDER — MAGNESIUM HYDROXIDE 400 MG/5ML PO SUSP: 30.0000 mL | Freq: Every day | ORAL | Status: DC | PRN

## 2021-10-11 NOTE — Progress Notes (Signed)
Patient ID: LOVADA BARWICK, female   DOB: 06-Oct-1975, 46 y.o.   MRN: [id number] ?Admit Note.  ?Patient admitted to [facility] voluntarily, status post overdose in a serious suicide attempt. Patient states she had been increasingly depressed prior to her overdose and had become depressed, withdrawn, not eating as much and not interacting, withdrawing. The patient states she began to hear voices to tell her to end her life and that she was a bad person. Pt becomes tearful when discussing this but states that she does not feel that way now and is grateful her attempt was not successful. She states she has a supportive husband, and two children. She currently denies any SI/HI or A/V Hallucinations but describes a history of MDD with prior treatment requiring ECT. She states she is currently followed by Dr. Latricia Heft in [place] and was also receiving Sylvia for her depression. She states she thought this was helpful for her depression and wants to get back on her medications '' I think I need the higher doses of Abilify.''  ?Patient oriented to unit and ward rules. She was given toiletries and admission paperwork completed. Pt is able to contract for safety and states she can come to staff to let them know if SI or self injurious thoughts arise.  ?Pt is safe, will con't to monitor q 15 per protocol.  ?

## 2021-10-11 NOTE — Tx Team (Signed)
Initial Treatment Plan ?10/11/2021 ?5:33 PM ?Elvin So ?QIO:[id number] ? ? ? ?PATIENT STRESSORS: ?Health problems   ? ? ?PATIENT STRENGTHS: ?Ability for insight  ?Average or above average intelligence  ?Capable of independent living  ?Communication skills  ?General fund of knowledge  ?Motivation for treatment/growth  ? ? ?PATIENT IDENTIFIED PROBLEMS: ?  ?  ?  ?  ?  ?  ?  ?  ?  ?  ? ?DISCHARGE CRITERIA:  ?Ability to meet basic life and health needs ?Adequate post-discharge living arrangements ?Improved stabilization in mood, thinking, and/or behavior ?Motivation to continue treatment in a less acute level of care ? ?PRELIMINARY DISCHARGE PLAN: ?Outpatient therapy ?Return to previous living arrangement ? ?PATIENT/FAMILY INVOLVEMENT: ?This treatment plan has been presented to and reviewed with the patient, Leslie Elliott, and/or family member, .  The patient and family have been given the opportunity to ask questions and make suggestions. ? ?Leonia Reader, RN ?10/11/2021, 5:33 PM ?

## 2021-10-11 NOTE — Plan of Care (Signed)
?  Problem: Education: ?Goal: Knowledge of [facility] Education information/materials will improve ?Outcome: Not Progressing ?Goal: Emotional status will improve ?Outcome: Not Progressing ?Goal: Mental status will improve ?Description: Pt is progressing.  ?Outcome: Not Progressing ?Goal: Verbalization of understanding the information provided will improve ?Outcome: Not Progressing ?  ?Problem: Activity: ?Goal: Interest or engagement in activities will improve ?Outcome: Not Progressing ?Goal: Sleeping patterns will improve ?Outcome: Not Progressing ?  ?Problem: Coping: ?Goal: Ability to verbalize frustrations and anger appropriately will improve ?Outcome: Not Progressing ?Goal: Ability to demonstrate self-control will improve ?Outcome: Not Progressing ?  ?Problem: Self-Concept: ?Goal: Will verbalize positive feelings about self ?Outcome: Not Progressing ?Goal: Level of anxiety will decrease ?Outcome: Not Progressing ?  ?Problem: Safety: ?Goal: Ability to disclose and discuss suicidal ideas will improve ?Outcome: Not Progressing ?Goal: Ability to identify and utilize support systems that promote safety will improve ?Outcome: Not Progressing ?  ?Problem: Role Relationship: ?Goal: Will demonstrate positive changes in social behaviors and relationships ?Outcome: Not Progressing ?  ?

## 2021-10-11 NOTE — Plan of Care (Signed)
Pt is progressing. She has been able to verbalize she is not suicidal and want her mood to improve.  ?

## 2021-10-11 NOTE — Progress Notes (Signed)
Report called to [facility].  Report given to [facility] ?

## 2021-10-11 NOTE — BH Assessment (Signed)
Patient has been accepted to [facility], Pending COVID Results.  ?Accepting physician is Dr. Weber Cooks.  ?Attending  Physician will be Dr. Weber Cooks.  ?Patient has been assigned to room 307, by [place].   ?Call report to [phone].  ?Representative/Transfer Coordinator is Kerry Dory ?Patient pre-admitted by [facility] Patient Access Seth Bake) ? ?[facility] Staff Colbert Ewing, TOC/SW) made aware of acceptance. ? ? ?

## 2021-10-11 NOTE — TOC Transition Note (Addendum)
Transition of Care (TOC) - CM/SW Discharge Note ? ? ?Patient Details  ?Name: Leslie Elliott ?MRN: [id number] ?Date of Birth: September 18, 1975 ? ?Transition of Care (TOC) CM/SW Contact:  ?Coralee Pesa, LCSWA ?Phone Number: ?10/11/2021, 1:03 PM ? ? ?Clinical Narrative:    ?Pt to be transferred to [facility] via [facility]. ?Nurse to call report to [phone]. ?Transport will be arranged pending a negative covid test. Voluntary paperwork signed and on the chart. ? ? ?Final next level of care: Psychiatric Hospital ?Barriers to Discharge: Barriers Resolved ? ? ?Patient Goals and CMS Choice ?Patient states their goals for this hospitalization and ongoing recovery are:: Pt states her goal is to recieve care and get better. ?CMS Medicare.gov Compare Post Acute Care list provided to:: Patient ?Choice offered to / list presented to : Patient ? ?Discharge Placement ?  ?           ?Patient chooses bed at:  ([facility]) ?Patient to be transferred to facility by: [facility] ?Name of family member notified: Mother ?Patient and family notified of of transfer: 10/11/21 ? ?Discharge Plan and Services ?  ?  ?           ?  ?  ?  ?  ?  ?  ?  ?  ?  ?  ? ?Social Determinants of Health (SDOH) Interventions ?  ? ? ?Readmission Risk Interventions ?No flowsheet data found. ? ? ? ? ?

## 2021-10-11 NOTE — Consult Note (Addendum)
[facility] Followup Face-to-Face Psychiatric Evaluation ? ? ?Service Date: October 11, 2021 ?LOS:  LOS: 6 days  ? ? ?Assessment  ?Leslie Elliott is a 46 y.o. female admitted medically for 10/05/2021  9:52 AM for Suicide attempt. She carries the psychiatric diagnoses of depression and anxiety and has a past medical history of ovarian cyst .Psychiatry was consulted for suicide attempt by  Jacky Kindle, MD.  ?  ?  ?Her initial presentation of  is most consistent with psychosis. She meets criteria for acute psychosis based on endorsing AH for a few day.  Current outpatient psychotropic medications include Abilify '5mg'$ , Wellbutrin XL '300mg'$  and historically she has had a decent response to these medications, with previous periods of stability on Abilify. She was partially compliant (has had periods off of compliance with suddenly discontinuation) with medications prior to admission as evidenced by patient and husband in room. On initial examination, patient endorsed that she had been having AH and endorsed that she had been feeling more depressed. Please see plan below for detailed recommendations.  ?  ?Patient does need inpatient psych hospitalization for med mgmt and stabilization. Patient is currently unmediated and has a hx of AVH and bizarre behaviors without medication. Patient affect is improved today and patient endorses the importance of receiving inpatient psych care and endorses that she will sign her self in voluntary to [facility]. Patient asked appropriate questions as she has never been to this facility.  ? ? ?Diagnoses:  ?Active Hospital problems: ?Principal Problem: ?  Drug overdose ?Active Problems: ?  MDD (major depressive disorder), recurrent, severe, with psychosis ([place]) ?  ? ? ?Plan  ?## Safety and Observation Level:  ?- Based on my clinical evaluation, I estimate the patient to be at mild to moderate risk of self harm in the current setting ?- At this time, we recommend a routine  level of observation. This decision is based on my review of the chart including patient's history and current presentation, interview of the patient, mental status examination, and consideration of suicide risk including evaluating suicidal ideation, plan, intent, suicidal or self-harm behaviors, risk factors, and protective factors. This judgment is based on our ability to directly address suicide risk, implement suicide prevention strategies and develop a safety plan while the patient is in the clinical setting. Please contact our team if there is a concern that risk level has changed. ?  ?  ?## Medications:  ?-- Continue to hold home Abilify and Wellbutrin XL ?  ?## Medical Decision Making Capacity:  ?-- Not formally assessed ?  ?## Further Work-up:  ?-- Recommend TSH, Lipid, A1c ?  ?-- most recent EKG on 3/14 had QtC of 413 ?-- Pertinent labwork reviewed earlier this admission includes:  ?  ?## Disposition:  ?-- [place] ?  ?## Behavioral / Environmental:  ?-- Reorient verbally ?  ?##Legal Status ?-- Voluntary ? ? ?Thank you for this consult request. Recommendations have been communicated to the primary team.  We will sign off at this time.  ? ? ?PGY-2 ?Freida Busman, MD ? ? ?followup history  ?Relevant Aspects of Hospital Course:  ?Admitted on 10/05/2021 for overdose. ? ?Patient Report:  ?On assessment today patient reports that she slept decently and has a good appetite. Patient's mother is in the room. Patient herself reports that she feels ready to go to a psych hospital and is willing to sign in voluntarily. Patient denies SI, HI and AVH. Patient and mom agree  that patient does not have a hx of being stable without medications, and understand that despite her lack of AVH since her OD it is best that she go to a inpatient psych hospital due to the behaviors that led to her presentation.  ?Collateral information:  ?  ?3/15 - Patient's husband was in the room during assessment, and he was quiet for  the first half of the assessment but interjected when patient became a bit confused about her medication regimen change timelines over the past few months. Husband showed pictures of patient's prescription bottles and endorsed that he had noticed that patient was "sliding" approx 2 Friday's ago. Husband reported that he will usually noticed when patient starts to go downhill and reach out to the psychiatrist. Husband reports that his concerns this most recent episode prompted patient to be started on Effexor 37.5 2 weeks ago and was increased to '75mg'$  4 days prior to OD.  Husband also endorsed that patient was not taking Abilify most of last year and was not very compliant the last 2 month s of 2022 and that there was concern for her mental health. Husband reports that patient will usually have episodes of AVH when she is not "medicated." Husband reports that when he started becoming concerning approx 2 weeks ago he began moving the kitchen knives and also administering patient medications to her to make sure she was compliant.  ?  ?Psychiatric History:  ?Prior Inpatient Therapy:  [facility] 2007 with ECT, 2022- discharged on Abilify 5 mg and Wellbutrin XL 300 mg ?Prior Outpatient Therapy:  Dr Latricia Heft ?  ?  ?Social History:  ?married ?  ?Tobacco use: Defer ?Alcohol use: Defer ?Drug use: Defer ?  ?Family History:  ?daughter with depression and anxiety ?The patient's family history is not on file. ? ?Medical History: ?Past Medical History:  ?Diagnosis Date  ? Anemia   ? Anxiety   ? Depression   ? Ovarian cyst   ? ? ?Surgical History: ?Past Surgical History:  ?Procedure Laterality Date  ? ECT TREATMENTS    ? ROBOTIC ASSISTED BILATERAL SALPINGO OOPHERECTOMY Left 10/30/2020  ? Procedure: XI ROBOTIC ASSISTED SALPINGO OOPHORECTOMY;  Surgeon: Benjaman Kindler, MD;  Location: [facility] ORS;  Service: Gynecology;  Laterality: Left;  ? ROBOTIC ASSISTED LAPAROSCOPIC OVARIAN CYSTECTOMY Bilateral 10/30/2020  ? Procedure: XI ROBOTIC ASSISTED  LAPAROSCOPIC OVARIAN CYSTECTOMY;  Surgeon: Benjaman Kindler, MD;  Location: [facility] ORS;  Service: Gynecology;  Laterality: Bilateral;  ? TUBAL LIGATION  2002  ? XI ROBOTIC ASSISTED SALPINGECTOMY Right 10/30/2020  ? Procedure: XI ROBOTIC ASSISTED SALPINGECTOMY;  Surgeon: Benjaman Kindler, MD;  Location: [facility] ORS;  Service: Gynecology;  Laterality: Right;  ? ? ?Medications:  ? ?Current Facility-Administered Medications:  ?  0.9 %  sodium chloride infusion, , Intravenous, PRN, Jacky Kindle, MD, Stopped at 10/09/21 1557 ?  acetaminophen (TYLENOL) tablet 650 mg, 650 mg, Oral, Q6H PRN, Chesley Mires, MD, 650 mg at 10/10/21 2115 ?  Chlorhexidine Gluconate Cloth 2 % PADS 6 each, 6 each, Topical, Daily, Elsie Lincoln, MD, 6 each at 10/11/21 1019 ?  enoxaparin (LOVENOX) injection 40 mg, 40 mg, Subcutaneous, Q24H, Sood, Vineet, MD, 40 mg at 10/11/21 1023 ?  feeding supplement (BOOST / RESOURCE BREEZE) liquid 1 Container, 1 Container, Oral, TID BM, Chesley Mires, MD, 1 Container at 10/11/21 1020 ?  haloperidol (HALDOL) tablet 2 mg, 2 mg, Oral, Q8H PRN, Magdalen Spatz, NP ?  prochlorperazine (COMPAZINE) injection 10 mg, 10 mg, Intravenous, Q4H PRN, Chesley Mires, MD ? ?  Allergies: ?Allergies  ?Allergen Reactions  ? Zofran [Ondansetron Hcl] Rash  ? ? ? ?  ?Objective  ?Vital signs:  ?Temp:  [97.6 ?F (36.4 ?C)-99 ?F (37.2 ?C)] 97.6 ?F (36.4 ?C) (03/16 8978) ?Pulse Rate:  [52-88] 60 (03/16 0806) ?Resp:  [14-19] 14 (03/16 0806) ?BP: (87-108)/(40-63) 105/58 (03/16 0806) ?SpO2:  [93 %-97 %] 97 % (03/16 0806) ?Weight:  [75.5 kg] 75.5 kg (03/16 0500) ? ?Psychiatric Specialty Exam: ? ?Presentation  ?General Appearance: Appropriate for Environment; Casual ? ?Eye Contact:Good ? ?Speech:Clear and Coherent ? ?Speech Volume:Normal ? ?Handedness:Right ? ? ?Mood and Affect  ?Mood:Euthymic ? ?Affect:Appropriate; Congruent ? ? ?Thought Process  ?Thought Processes:Goal Directed ? ?Descriptions of Associations:Circumstantial ? ?Orientation:Full (Time, Place  and Person) ? ?Thought Content:Logical ? ?History of Schizophrenia/Schizoaffective disorder:No ? ?Duration of Psychotic Symptoms:N/A ? ?Hallucinations:Hallucinations: None ? ?Ideas of Reference:None ? ?Suicidal

## 2021-10-11 NOTE — Progress Notes (Signed)
Safe transport called, they are waiting by the discharge lobby.  Patient taken downstairs by staff.  Discharged to [facility]. ?

## 2021-10-12 DIAGNOSIS — F332 Major depressive disorder, recurrent severe without psychotic features: Secondary | ICD-10-CM

## 2021-10-12 MED ORDER — CYANOCOBALAMIN 500 MCG PO TABS
500.0000 ug | ORAL_TABLET | Freq: Every day | ORAL | Status: DC
  Administered 2021-10-12 – 2021-10-15 (×4): 500 ug via ORAL
  Filled 2021-10-12 (×4): qty 1

## 2021-10-12 MED ORDER — ARIPIPRAZOLE 5 MG PO TABS
5.0000 mg | ORAL_TABLET | Freq: Every day | ORAL | Status: DC
Start: 2021-10-12 — End: 2021-10-15
  Administered 2021-10-12 – 2021-10-15 (×4): 5 mg via ORAL
  Filled 2021-10-12 (×4): qty 1

## 2021-10-12 MED ORDER — BUPROPION HCL ER (XL) 150 MG PO TB24
300.0000 mg | ORAL_TABLET | Freq: Every day | ORAL | Status: DC
  Administered 2021-10-12 – 2021-10-15 (×4): 300 mg via ORAL
  Filled 2021-10-12 (×4): qty 2

## 2021-10-12 NOTE — BH IP Treatment Plan (Signed)
Interdisciplinary Treatment and Diagnostic Plan Update ? ?10/12/2021 ?Time of Session: 9:30AM ?Leslie Elliott ?MRN: [id number] ? ?Principal Diagnosis: Severe recurrent major depression without psychotic features ([place]) ? ?Secondary Diagnoses: Principal Problem: ?  Severe recurrent major depression without psychotic features ([place]) ? ? ?Current Medications:  ?Current Facility-Administered Medications  ?Medication Dose Route Frequency Provider Last Rate Last Admin  ? acetaminophen (TYLENOL) tablet 650 mg  650 mg Oral Q6H PRN Clapacs, Madie Reno, MD      ? alum & mag hydroxide-simeth (MAALOX/MYLANTA) 200-200-20 MG/5ML suspension 30 mL  30 mL Oral Q4H PRN Clapacs, John T, MD      ? ARIPiprazole (ABILIFY) tablet 5 mg  5 mg Oral Daily Clapacs, John T, MD      ? buPROPion (WELLBUTRIN XL) 24 hr tablet 300 mg  300 mg Oral Daily Clapacs, John T, MD      ? hydrOXYzine (ATARAX) tablet 50 mg  50 mg Oral TID PRN Clapacs, Madie Reno, MD   50 mg at 10/11/21 2125  ? magnesium hydroxide (MILK OF MAGNESIA) suspension 30 mL  30 mL Oral Daily PRN Clapacs, Madie Reno, MD      ? traZODone (DESYREL) tablet 100 mg  100 mg Oral QHS PRN Clapacs, Madie Reno, MD   100 mg at 10/11/21 2125  ? ?PTA Medications: ?Medications Prior to Admission  ?Medication Sig Dispense Refill Last Dose  ? haloperidol (HALDOL) 2 MG tablet Take 1 tablet (2 mg total) by mouth every 8 (eight) hours as needed for agitation (severe agitation - hold for QTc >500).     ? ? ?Patient Stressors: Health problems   ? ?Patient Strengths: Ability for insight  ?Average or above average intelligence  ?Capable of independent living  ?Communication skills  ?General fund of knowledge  ?Motivation for treatment/growth  ? ?Treatment Modalities: Medication Management, Group therapy, Case management,  ?1 to 1 session with clinician, Psychoeducation, Recreational therapy. ? ? ?Physician Treatment Plan for Primary Diagnosis: Severe recurrent major depression without psychotic features ([place]) ?Long Term  Goal(s):    ? ?Short Term Goals:   ? ?Medication Management: Evaluate patient's response, side effects, and tolerance of medication regimen. ? ?Therapeutic Interventions: 1 to 1 sessions, Unit Group sessions and Medication administration. ? ?Evaluation of Outcomes: Not Met ? ?Physician Treatment Plan for Secondary Diagnosis: Principal Problem: ?  Severe recurrent major depression without psychotic features ([place]) ? ?Long Term Goal(s):    ? ?Short Term Goals:      ? ?Medication Management: Evaluate patient's response, side effects, and tolerance of medication regimen. ? ?Therapeutic Interventions: 1 to 1 sessions, Unit Group sessions and Medication administration. ? ?Evaluation of Outcomes: Not Met ? ? ?RN Treatment Plan for Primary Diagnosis: Severe recurrent major depression without psychotic features ([place]) ?Long Term Goal(s): Knowledge of disease and therapeutic regimen to maintain health will improve ? ?Short Term Goals: Ability to demonstrate self-control, Ability to participate in decision making will improve, Ability to verbalize feelings will improve, Ability to disclose and discuss suicidal ideas, Ability to identify and develop effective coping behaviors will improve, and Compliance with prescribed medications will improve ? ?Medication Management: RN will administer medications as ordered by provider, will assess and evaluate patient's response and provide education to patient for prescribed medication. RN will report any adverse and/or side effects to prescribing provider. ? ?Therapeutic Interventions: 1 on 1 counseling sessions, Psychoeducation, Medication administration, Evaluate responses to treatment, Monitor vital signs and CBGs as ordered, Perform/monitor CIWA, COWS, AIMS and Fall Risk screenings as  ordered, Perform wound care treatments as ordered. ? ?Evaluation of Outcomes: Not Met ? ? ?LCSW Treatment Plan for Primary Diagnosis: Severe recurrent major depression without psychotic features  ([place]) ?Long Term Goal(s): Safe transition to appropriate next level of care at discharge, Engage patient in therapeutic group addressing interpersonal concerns. ? ?Short Term Goals: Engage patient in aftercare planning with referrals and resources, Increase social support, Increase ability to appropriately verbalize feelings, Increase emotional regulation, Facilitate acceptance of mental health diagnosis and concerns, and Increase skills for wellness and recovery ? ?Therapeutic Interventions: Assess for all discharge needs, 1 to 1 time with Education officer, museum, Explore available resources and support systems, Assess for adequacy in community support network, Educate family and significant other(s) on suicide prevention, Complete Psychosocial Assessment, Interpersonal group therapy. ? ?Evaluation of Outcomes: Not Met ? ? ?Progress in Treatment: ?Attending groups: Yes. ?Participating in groups: Yes. ?Taking medication as prescribed: Yes. ?Toleration medication: Yes. ?Family/Significant other contact made: No, will contact:  once permission is given. ?Patient understands diagnosis: Yes. ?Discussing patient identified problems/goals with staff: Yes. ?Medical problems stabilized or resolved: Yes. ?Denies suicidal/homicidal ideation: Yes. ?Issues/concerns per patient self-inventory: No. ?Other: none ? ?New problem(s) identified: No, Describe:  none ? ?New Short Term/Long Term Goal(s): elimination of symptoms of psychosis, medication management for mood stabilization; elimination of SI thoughts; development of comprehensive mental wellness plan.  ? ?Patient Goals:  "making sure I am better and can go home soon" ? ?Discharge Plan or Barriers: CSW to assist patient in developing appropriate discharge plans.   ? ?Reason for Continuation of Hospitalization: Anxiety ?Depression ?Medication stabilization ?Suicidal ideation ? ?Estimated Length of Stay:  1-7 days ? ? ?Scribe for Treatment Team: ?Rozann Lesches,  LCSW ?10/12/2021 ?2:53 PM ?

## 2021-10-12 NOTE — Group Note (Signed)
[facility] LCSW Group Therapy Note ? ? ?Group Date: 10/12/2021 ?Start Time: 1300 ?End Time: 1400 ? ? ?Type of Therapy/Topic:  Group Therapy:  Emotion Regulation ? ?Participation Level:  Minimal  ? ?Mood: ? ?Description of Group:   ? The purpose of this group is to assist patients in learning to regulate negative emotions and experience positive emotions. Patients will be guided to discuss ways in which they have been vulnerable to their negative emotions. These vulnerabilities will be juxtaposed with experiences of positive emotions or situations, and patients challenged to use positive emotions to combat negative ones. Special emphasis will be placed on coping with negative emotions in conflict situations, and patients will process healthy conflict resolution skills. ? ?Therapeutic Goals: ?Patient will identify two positive emotions or experiences to reflect on in order to balance out negative emotions:  ?Patient will label two or more emotions that they find the most difficult to experience:  ?Patient will be able to demonstrate positive conflict resolution skills through discussion or role plays:  ? ?Summary of Patient Progress: ?Patient was present in group.  Patient was quiet and attentive in group and supportive of others. Patient shared that she has been "disappointed".  Patient shared that her coping skills coloring.   ? ?Therapeutic Modalities:   ?Cognitive Behavioral Therapy ?Feelings Identification ?Dialectical Behavioral Therapy ? ? ?Rozann Lesches, LCSW ?

## 2021-10-12 NOTE — Plan of Care (Addendum)
D: Pt alert and oriented. Pt rates depression 3/10, hopelessness 0/10, and anxiety 1/10. Pt goal: "Getting back home." Pt reports energy level as normal and concentration as being good. Pt reports sleep last night as being fair. Pt did not receive medications for sleep. Pt reports experiencing 1/10 left hand pain at this time, prns offered. Pt denies experiencing any SI/HI, or AVH at this time.  ? ?A: Scheduled medications administered to pt, per MD orders. Support and encouragement provided. Frequent verbal contact made. Routine safety checks conducted q15 minutes.  ? ?R: No adverse drug reactions noted. Pt verbally contracts for safety at this time. Pt compliant with medications. Pt interacts well with others on the unit. Pt remains safe at this time. Will continue to monitor.  ? ?Problem: Education: ?Goal: Knowledge of [facility] Education information/materials will improve ?Outcome: Progressing ?  ?Problem: Education: ?Goal: Emotional status will improve ?Outcome: Not Progressing ?  ?

## 2021-10-12 NOTE — BHH Counselor (Signed)
Adult Comprehensive Assessment ? ?Patient ID: Leslie Elliott, female   DOB: 08-10-1975, 46 y.o.   MRN: [id number] ? ?Information Source: ?Information source: Patient ? ?Current Stressors:  ?Patient states their primary concerns and needs for treatment are:: "having taken all those pills" ?Patient states their goals for this hospitilization and ongoing recovery are:: "remain in a place where I don't have those feelings/urges" ?Educational / Learning stressors: Pt denies.  ?Employment / Job issues: Pt denies. ?Family Relationships: Pt denies. ?Financial / Lack of resources (include bankruptcy): ?the rising cost of living? ?Housing / Lack of housing: Pt denies. ?Physical health (include injuries & life threatening diseases): Pt denies. ?Social relationships: Pt denies.  ?Substance abuse: Pt denies ?Bereavement / Loss: Pt denies. ?  ?Living/Environment/Situation:  ?Living Arrangements: Spouse/significant other, Children ?Living conditions (as described by patient or guardian): "Really nice, neighbors nice and friendly." ?Who else lives in the home?: "My husband and oldest and youngest son." ?How long has patient lived in current situation?: "Jan 2022? ?What is atmosphere in current home: Comfortable, Loving, Supportive ?  ?Family History:  ?Marital status: Married ?Number of Years Married: 37  ?What types of issues is patient dealing with in the relationship?: None reported ?Additional relationship information: None reported ?Are you sexually active?: Yes ?What is your sexual orientation?: Heterosexual ?Has your sexual activity been affected by drugs, alcohol, medication, or emotional stress?: N/A ?Does patient have children?: Yes ?How many children?: 3 ?How is patient's relationship with their children?: "Good" ?  ?Childhood History:  ?By whom was/is the patient raised?: Mother/father and step-parent ?Additional childhood history information: She reports she was raised by both parents until she was [age] years  of age when they divorced. She states that she lived with her mother and stepfather and went to father and stepmother's every other weekend. ?Description of patient's relationship with caregiver when they were a child: "Good" ?Patient's description of current relationship with people who raised him/her: "Good" ?How were you disciplined when you got in trouble as a child/adolescent?: "Occasional spanking" ?Does patient have siblings?: Yes ?Number of Siblings: 2 (Pt is the middle child) ?Description of patient's current relationship with siblings: "Good" ?Did patient suffer any verbal/emotional/physical/sexual abuse as a child?: No ?Did patient suffer from severe childhood neglect?: No ?Has patient ever been sexually abused/assaulted/raped as an adolescent or adult?: No ?Was the patient ever a victim of a crime or a disaster?: No ?Witnessed domestic violence?: No ?Has patient been affected by domestic violence as an adult?: No ?  ?Education:  ?Highest grade of school patient has completed: High school graduate with some college. ?Currently a student?: No ?Learning disability?: No ?  ?Employment/Work Situation:   ?Employment Situation: On disability ?Why is Patient on Disability: Mental health ?How Long has Patient Been on Disability: "Since 2007, 2008" ?Patient's Job has Been Impacted by Current Illness: No ?What is the Longest Time Patient has Held a Job?: Ten years ?Where was the Patient Employed at that Time?: She reports working part-time at [employer]. ?Has Patient ever Been in the Military?: No ?  ?Financial Resources:   ?Museum/gallery curator resources: [employer], [employer], Support from parents / caregiver, Income from spouse ?Does patient have a representative payee or guardian?: No ? ?Alcohol/Substance Abuse:   ?What has been your use of drugs/alcohol within the last 12 months?: Pt denies ?If attempted suicide, did drugs/alcohol play a role in this?:  (N/A) ?Alcohol/Substance Abuse Treatment Hx: Denies past  history ?If yes, describe treatment: N/A ?Has alcohol/substance abuse  ever caused legal problems?: No ?  ?Social Support System:   ?Patient's Community Support System: Good  ?Describe Community Support System: "all of my family members and my church pastor?  ?Type of faith/religion: Darrick Meigs ?How does patient's faith help to cope with current illness?: "Pray, go to church." ?  ?Leisure/Recreation:   ?Do You Have Hobbies?: Yes ?Leisure and Hobbies: "Draw, read, listen to music, play games? ?  ?Strengths/Needs:   ?What is the patient's perception of their strengths?: "I think that I am a loyal and kind person. That I'm helpful.?  ?Patient states they can use these personal strengths during their treatment to contribute to their recovery: Pt denies. ?Patient states these barriers may affect/interfere with their treatment: Pt denies any ?Patient states these barriers may affect their return to the community: Pt denies ?Other important information patient would like considered in planning for their treatment: N/A ?  ?Discharge Plan:   ?Currently receiving community mental health services: Yes (From Whom) (Dr. Altamese Luquillo at [facility])  ?Patient states concerns and preferences for aftercare planning are: She states that her provider gave her a list of other doctors who pt could try to see. Pt states that she needs to go over this with her husband and then make a decision. ?Patient states they will know when they are safe and ready for discharge when: "I feel like I have (met my goals)." ?Does patient have access to transportation?: Yes ?Does patient have financial barriers related to discharge medications?: No ?Patient description of barriers related to discharge medications: N/A ?Will patient be returning to same living situation after discharge?: Yes ? ?Summary/Recommendations:   ?Summary and Recommendations (to be completed by the evaluator): Patient is a 46 year old, married female from [place], [place] [facility]  She presents to the hospital following a suicide attempt via overdose.  She reports that her current triggers have been some financial strain resulting from the rising cost of living.  She also reports having feel down and depressed as well.  She reports her goal for treatment is to remain in a place where she remains mentally well with no thoughts of harming herself.  She denies any substance use or history of trauma history.  She lives at home with her husband and two of her children.   She reports that she is current with her mental provider, Elliott and plans to continue with them following discharge.  She reports that she has been on disability since 2007 or 2008 and has Medicare.  Recommendations include crisis stabilization, therapeutic milieu, encourage group attendance and participation, medication management for mood stabilization, and development of comprehensive mental wellness plan. ? ?Rozann Lesches. 10/12/2021 ?

## 2021-10-12 NOTE — Progress Notes (Signed)
Patient is quiet and reserved. She is pleasant and cooperative and will converse when approached. She has been isolative to her room except for snack, but did interact well with others when in group environment.  She denies si  hi avh depression anxiety and pain at this encounter. She reports eating and sleeping well. She is med complaint and received her meds without incident.  Although she reports feeling better and denies all. She presents depressed and at times is preoccupied in thought to were you have to call her name more than once to get her attention. Her appearance mood and affect is incongruent with what she is currently reporting. Will continue to offer support and encouragement to seek staff with any concerns.  Q 15 minute safety checks in place. ? ? ?C Butler-Nicholson, LPN ?

## 2021-10-12 NOTE — H&P (Signed)
Psychiatric Admission Assessment Adult ? ?Patient Identification: Leslie Elliott ?MRN:  [id number] ?Date of Evaluation:  10/12/2021 ?Chief Complaint:  Severe recurrent major depression without psychotic features ([place]) [F33.2] ?Principal Diagnosis: Severe recurrent major depression without psychotic features ([place]) ?Diagnosis:  Principal Problem: ?  Severe recurrent major depression without psychotic features ([place]) ? ?History of Present Illness: Patient seen and chart reviewed.  46 year old woman was admitted to the [facility] on March 10 after presenting with an acute overdose of multiple psychiatric medicines.  There are conflicting reports in the chart but it sounds like the medicine she overdosed on includes propranolol Abilify possibly something else.  Patient denied that she wanted to kill herself but at the time stated that she took them because she could hear the voice of Satan telling her that she was a bad person and that she ought to kill herself.  She admits that she had been feeling particularly bad for about a week prior to the event.  She had started to have intrusive racing thoughts, negative auditory hallucinations lack of interest and focus and normal activities lack of appetite.  She had just started receiving transcranial magnetic stimulation earlier in the week for her worsening depression.  Patient is not a very clear historian about her current medicine.  She remembers that she is still taking Wellbutrin and Abilify but does not remember other specifics.  Today the patient says she is feeling better.  Still a little bit anxious but denies feeling overwhelmingly depressed.  Denies that she is having any intrusive negative thoughts.  Denies having any suicidal ideation.  Patient does not drink had not been abusing any drugs.  Does not report any specific major new life stresses to account for her worsening mood. ?Associated Signs/Symptoms: ?Depression Symptoms:  depressed  mood, ?insomnia, ?fatigue, ?feelings of worthlessness/guilt, ?difficulty concentrating, ?suicidal attempt, ?Duration of Depression Symptoms: Greater than two weeks ? ?(Hypo) Manic Symptoms:  Impulsivity, ?Anxiety Symptoms:  Excessive Worry, ?Psychotic Symptoms:  Hallucinations: Auditory ?PTSD Symptoms: ?Negative ?Total Time spent with patient: 1 hour ? ?Past Psychiatric History: Patient has a long history of recurrent severe depression.  She has had suicide attempts in the past.  Patient used to be a regular ECT patient but stopped receiving ECT about a decade ago.  She has had hospitalizations here at our facility as recently as last summer.  He does see an outpatient provider in the community.  It does not sound like mania has been part of the picture. ? ?Is the patient at risk to self? Yes.    ?Has the patient been a risk to self in the past 6 months? Yes.    ?Has the patient been a risk to self within the distant past? Yes.    ?Is the patient a risk to others? No.  ?Has the patient been a risk to others in the past 6 months? No.  ?Has the patient been a risk to others within the distant past? No.  ? ?Prior Inpatient Therapy:   ?Prior Outpatient Therapy:   ? ?Alcohol Screening: Patient refused Alcohol Screening Tool: Yes ?1. How often do you have a drink containing alcohol?: Never ?2. How many drinks containing alcohol do you have on a typical day when you are drinking?: 1 or 2 ?3. How often do you have six or more drinks on one occasion?: Never ?AUDIT-C Score: 0 ?4. How often during the last year have you found that you were not able to stop drinking once you  had started?: Never ?5. How often during the last year have you failed to do what was normally expected from you because of drinking?: Never ?6. How often during the last year have you needed a first drink in the morning to get yourself going after a heavy drinking session?: Never ?7. How often during the last year have you had a feeling of guilt of remorse  after drinking?: Never ?8. How often during the last year have you been unable to remember what happened the night before because you had been drinking?: Never ?9. Have you or someone else been injured as a result of your drinking?: No ?10. Has a relative or friend or a doctor or another health worker been concerned about your drinking or suggested you cut down?: No ?Alcohol Use Disorder Identification Test Final Score (AUDIT): 0 ?Substance Abuse History in the last 12 months:  No. ?Consequences of Substance Abuse: ?Negative ?Previous Psychotropic Medications: Yes  ?Psychological Evaluations: Yes  ?Past Medical History:  ?Past Medical History:  ?Diagnosis Date  ? Anemia   ? Anxiety   ? Depression   ? Ovarian cyst   ?  ?Past Surgical History:  ?Procedure Laterality Date  ? ECT TREATMENTS    ? ROBOTIC ASSISTED BILATERAL SALPINGO OOPHERECTOMY Left 10/30/2020  ? Procedure: XI ROBOTIC ASSISTED SALPINGO OOPHORECTOMY;  Surgeon: Benjaman Kindler, MD;  Location: [facility] ORS;  Service: Gynecology;  Laterality: Left;  ? ROBOTIC ASSISTED LAPAROSCOPIC OVARIAN CYSTECTOMY Bilateral 10/30/2020  ? Procedure: XI ROBOTIC ASSISTED LAPAROSCOPIC OVARIAN CYSTECTOMY;  Surgeon: Benjaman Kindler, MD;  Location: [facility] ORS;  Service: Gynecology;  Laterality: Bilateral;  ? TUBAL LIGATION  2002  ? XI ROBOTIC ASSISTED SALPINGECTOMY Right 10/30/2020  ? Procedure: XI ROBOTIC ASSISTED SALPINGECTOMY;  Surgeon: Benjaman Kindler, MD;  Location: [facility] ORS;  Service: Gynecology;  Laterality: Right;  ? ?Family History:  ?Family History  ?Problem Relation Age of Onset  ? Breast cancer Neg Hx   ? ?Family Psychiatric  History: None reported ?Tobacco Screening:   ?Social History:  ?Social History  ? ?Substance and Sexual Activity  ?Alcohol Use Yes  ? Comment: occassional  ?   ?Social History  ? ?Substance and Sexual Activity  ?Drug Use Never  ?  ?Additional Social History: ?  ?   ?  ?  ?  ?  ?  ?  ?  ?  ?  ?  ? ?Allergies:   ?Allergies  ?Allergen Reactions  ? Zofran  [Ondansetron Hcl] Rash  ? ?Lab Results:  ?Results for orders placed or performed during the hospital encounter of 10/05/21 (from the past 48 hour(s))  ?Resp Panel by RT-PCR (Flu A&B, Covid) Nasopharyngeal Swab     Status: None  ? Collection Time: 10/11/21 12:16 PM  ? Specimen: Nasopharyngeal Swab; Nasopharyngeal(NP) swabs in vial transport medium  ?Result Value Ref Range  ? SARS Coronavirus 2 by RT PCR NEGATIVE NEGATIVE  ?  Comment: (NOTE) ?SARS-CoV-2 target nucleic acids are NOT DETECTED. ? ?The SARS-CoV-2 RNA is generally detectable in upper respiratory ?specimens during the acute phase of infection. The lowest ?concentration of SARS-CoV-2 viral copies this assay can detect is ?138 copies/mL. A negative result does not preclude SARS-Cov-2 ?infection and should not be used as the sole basis for treatment or ?other patient management decisions. A negative result may occur with  ?improper specimen collection/handling, submission of specimen other ?than nasopharyngeal swab, presence of viral mutation(s) within the ?areas targeted by this assay, and inadequate number of viral ?copies(<138 copies/mL). A negative result  must be combined with ?clinical observations, patient history, and epidemiological ?information. The expected result is Negative. ? ?Fact Sheet for Patients:  ?EntrepreneurPulse.com.au ? ?Fact Sheet for Healthcare Providers:  ?IncredibleEmployment.be ? ?This test is no t yet approved or cleared by the [place] FDA and  ?has been authorized for detection and/or diagnosis of SARS-CoV-2 by ?FDA under an Emergency Use Authorization (EUA). This EUA will remain  ?in effect (meaning this test can be used) for the duration of the ?COVID-19 declaration under Section 564(b)(1) of the Act, 21 ?U.S.C.section 360bbb-3(b)(1), unless the authorization is terminated  ?or revoked sooner.  ? ? ?  ? Influenza A by PCR NEGATIVE NEGATIVE  ? Influenza B by PCR NEGATIVE NEGATIVE  ?   Comment: (NOTE) ?The Xpert Xpress SARS-CoV-2/FLU/RSV plus assay is intended as an aid ?in the diagnosis of influenza from Nasopharyngeal swab specimens and ?should not be used as a sole basis for treatment. Nas

## 2021-10-12 NOTE — BHH Suicide Risk Assessment (Signed)
[facility] Admission Suicide Risk Assessment ? ? ?Nursing information obtained from:  Patient ?Demographic factors:  Caucasian ?Current Mental Status:  NA ?Loss Factors:  NA ?Historical Factors:  NA ?Risk Reduction Factors:  Positive social support ? ?Total Time spent with patient: 1 hour ?Principal Problem: Severe recurrent major depression without psychotic features ([place]) ?Diagnosis:  Principal Problem: ?  Severe recurrent major depression without psychotic features ([place]) ? ?Subjective Data: Patient seen and chart reviewed.  46 year old woman transferred to [place] from [facility] where she had been hospitalized following an overdose on multiple medications.  Today patient reports that her mood is feeling closer to normal.  She denies having any suicidal thoughts or intrusive negative thoughts.  Denies hallucinations.  Lucid with decent insight and able to articulate an appropriate plan for treatment ? ?Continued Clinical Symptoms:  ?Alcohol Use Disorder Identification Test Final Score (AUDIT): 0 ?The "Alcohol Use Disorders Identification Test", Guidelines for Use in Primary Care, Second Edition.  World Pharmacologist [facility]). ?Score between 0-7:  no or low risk or alcohol related problems. ?Score between 8-15:  moderate risk of alcohol related problems. ?Score between 16-19:  high risk of alcohol related problems. ?Score 20 or above:  warrants further diagnostic evaluation for alcohol dependence and treatment. ? ? ?CLINICAL FACTORS:  ? Depression:   Impulsivity ? ? ?Musculoskeletal: ?Strength & Muscle Tone: within normal limits ?Gait & Station: normal ?Patient leans: N/A ? ?Psychiatric Specialty Exam: ? ?Presentation  ?General Appearance: Appropriate for Environment; Casual ? ?Eye Contact:Good ? ?Speech:Clear and Coherent ? ?Speech Volume:Normal ? ?Handedness:Right ? ? ?Mood and Affect  ?Mood:Euthymic ? ?Affect:Appropriate; Congruent ? ? ?Thought Process  ?Thought Processes:Goal Directed ? ?Descriptions of  Associations:Circumstantial ? ?Orientation:Full (Time, Place and Person) ? ?Thought Content:Logical ? ?History of Schizophrenia/Schizoaffective disorder:No ? ?Duration of Psychotic Symptoms:N/A ? ?Hallucinations:Hallucinations: None ? ?Ideas of Reference:None ? ?Suicidal Thoughts:Suicidal Thoughts: No ? ?Homicidal Thoughts:Homicidal Thoughts: No ? ? ?Sensorium  ?Memory:Immediate Good; Recent Good ? ?Judgment:Fair ? ?Insight:Fair ? ? ?Executive Functions  ?Concentration:Good ? ?Attention Span:Good ? ?Recall:Good ? ?[employer] [place] ? ?Language:Good ? ? ?Psychomotor Activity  ?Psychomotor Activity:Psychomotor Activity: Decreased ? ? ?Assets  ?Assets:Resilience; Armed forces logistics/support/administrative officer; Desire for Improvement; Social Support; Housing ? ? ?Sleep  ?Sleep:Sleep: Fair ? ? ? ?Physical Exam: ?Physical Exam ?Vitals and nursing note reviewed.  ?Constitutional:   ?   Appearance: Normal appearance.  ?HENT:  ?   Head: Normocephalic and atraumatic.  ?   Mouth/Throat:  ?   Pharynx: Oropharynx is clear.  ?Eyes:  ?   Pupils: Pupils are equal, round, and reactive to light.  ?Cardiovascular:  ?   Rate and Rhythm: Normal rate and regular rhythm.  ?Pulmonary:  ?   Effort: Pulmonary effort is normal.  ?   Breath sounds: Normal breath sounds.  ?Abdominal:  ?   General: Abdomen is flat.  ?   Palpations: Abdomen is soft.  ?Musculoskeletal:     ?   General: Normal range of motion.  ?Skin: ?   General: Skin is warm and dry.  ?Neurological:  ?   General: No focal deficit present.  ?   Mental Status: She is alert. Mental status is at baseline.  ?Psychiatric:     ?   Attention and Perception: Attention normal.     ?   Mood and Affect: Mood normal. Affect is blunt.     ?   Speech: Speech normal.     ?   Behavior: Behavior is cooperative.     ?  Thought Content: Thought content normal.     ?   Cognition and Memory: Memory is impaired.     ?   Judgment: Judgment normal.  ? ?Review of Systems  ?Constitutional: Negative.   ?HENT: Negative.     ?Eyes: Negative.   ?Respiratory: Negative.    ?Cardiovascular: Negative.   ?Gastrointestinal: Negative.   ?Musculoskeletal: Negative.   ?Skin: Negative.   ?Neurological: Negative.   ?Psychiatric/Behavioral:  Negative for depression, hallucinations, memory loss, substance abuse and suicidal ideas. The patient is nervous/anxious. The patient does not have insomnia.   ?Blood pressure (!) 112/50, pulse (!) 58, temperature 97.9 ?F (36.6 ?C), temperature source Oral, resp. rate 17, height _0  (1.626 m), weight 74.8 kg, SpO2 100 %. Body mass index is 28.32 kg/m?. ? ? ?COGNITIVE FEATURES THAT CONTRIBUTE TO RISK:  ?None   ? ?SUICIDE RISK:  ? Mild:  Suicidal ideation of limited frequency, intensity, duration, and specificity.  There are no identifiable plans, no associated intent, mild dysphoria and related symptoms, good self-control (both objective and subjective assessment), few other risk factors, and identifiable protective factors, including available and accessible social support. ? ?PLAN OF CARE: Continue 15-minute checks.  Restart antidepressant medicine largely based on what was helpful last summer.  Engage in individual and group assessment.  She met with treatment team today.  We discussed ECT but will table making a decision about that until we see how she is feeling the next 1 to 2 days. ? ?I certify that inpatient services furnished can reasonably be expected to improve the patient's condition.  ? ?Alethia Berthold, MD ?10/12/2021, 2:57 PM ? ?

## 2021-10-12 NOTE — Progress Notes (Signed)
Recreation Therapy Notes ? ?Date: 10/12/2021 ? ?Time: 10:45 am   ? ?Location: Courtyard  ? ?Behavioral response: Appropriate ? ?Intervention Topic:  Leisure  ? ?Discussion/Intervention:  ?Group content today was focused on leisure. The group defined what leisure is and some positive leisure activities they participate in. Individuals identified the difference between good and bad leisure. Participants expressed how they feel after participating in the leisure of their choice. The group discussed how they go about picking a leisure activity and if others are involved in their leisure activities. The patient stated how many leisure activities they have to choose from and reasons why it is important to have leisure time. Individuals participated in the intervention ?Exploration of Leisure? where they had a chance to identify new leisure activities as well as benefits of leisure. ?Clinical Observations/Feedback: ?Patient came to group and was focused on what peers and staff had to say about leisure. Individual was social with staff and peers while participating in the intervention.  ?Messiyah Waterson LRT/CTRS  ? ? ? ? ? ? ? ?Altan Kraai ?10/12/2021 12:43 PM ?

## 2021-10-12 NOTE — Progress Notes (Signed)
Recreation Therapy Notes ? ?INPATIENT RECREATION TR PLAN ? ?Patient Details ?Name: Leslie Elliott ?MRN: [id number] ?DOB: 09-06-1975 ?Today's Date: 10/12/2021 ? ?Rec Therapy Plan ?Is patient appropriate for Therapeutic Recreation?: Yes ?Treatment times per week: at least 3 ?Estimated Length of Stay: 5-7 days ?TR Treatment/Interventions: Group participation (Comment) ? ?Discharge Criteria ?Pt will be discharged from therapy if:: Discharged ?Treatment plan/goals/alternatives discussed and agreed upon by:: Patient/family ? ?Discharge Summary ?  ? ? ?Madline Oesterling ?10/12/2021, 1:17 PM ?

## 2021-10-12 NOTE — Progress Notes (Signed)
Recreation Therapy Notes ? ?INPATIENT RECREATION THERAPY ASSESSMENT ? ?Patient Details ?Name: Leslie Elliott ?MRN: [id number] ?DOB: [birth year] ?Today's Date: 10/12/2021 ?      ?Information Obtained From: ?Patient ? ?Able to Participate in Assessment/Interview: ?Yes ? ?Patient Presentation: ?Responsive ? ?Reason for Admission (Per Patient): ?Active Symptoms, Suicidal Ideation, Suicide Attempt ? ?Patient Stressors: ?  ? ?Coping Skills:   ?Other (Comment), Talk, Deep Breathing, Impulsivity (Games on the phone) ? ?Leisure Interests (2+):  ?Games - [employer], Music - Listen, Individual - TV, [place] ? ?Frequency of Recreation/Participation: ?Monthly ? ?Awareness of Community Resources:  ?Yes ? ?Community Resources:  ?Church ? ?Current Use: ?Yes ? ?If no, Barriers?: ?  ? ?Expressed Interest in [employer] Information: ?Yes ? ?[place] of Residence:  ?[place] ? ?Patient Main Form of Transportation: ?Car ? ?Patient Strengths:  ?Kind ? ?Patient Identified Areas of Improvement:  ?Not focus on the negative ? ?Patient Goal for Hospitalization:  ?Get back on a healthy schedule and my mental stable. ? ?Current SI (including self-harm):  ?No ? ?Current HI:  ?No ? ?Current AVH: ?No ? ?Staff Intervention Plan: ?Group Attendance, Collaborate with Interdisciplinary Treatment Team ? ?Consent to Intern Participation: ?N/A ? ?[name] ?10/12/2021, 1:16 PM ?

## 2021-10-13 NOTE — Group Note (Signed)
[place] LCSW Group Therapy Note ? ? ?Group Date: 10/13/2021 ?Start Time: 1320 ?End Time: 0277 ? ? ?Type of Therapy and Topic: Group Therapy: Avoiding Self-Sabotaging and Enabling Behaviors ? ?Participation Level: Minimal ? ?Description of Group:  ?In this group, patients will learn how to identify obstacles, self-sabotaging and enabling behaviors, as well as: what are they, why do we do them and what needs these behaviors meet. Discuss unhealthy relationships and how to have positive healthy boundaries with those that sabotage and enable. Explore aspects of self-sabotage and enabling in yourself and how to limit these self-destructive behaviors in everyday life. ? ? ?Therapeutic Goals: ?1. Patient will identify one obstacle that relates to self-sabotage and enabling behaviors ?2. Patient will identify one personal self-sabotaging or enabling behavior they did prior to admission ?3. Patient will state a plan to change the above identified behavior ?4. Patient will demonstrate ability to communicate their needs through discussion and/or role play.  ? ? ?Summary of Patient Progress: Patient was present for the entirety of the group session. Patient presented as reserved but appeared attentive during group discussion. Patient did not share a self-sabotaging behavior. However, she identified counting as a coping skill to manage challenging emotions.  ? ? ?Therapeutic Modalities:  ?Cognitive Behavioral Therapy ?Person-Centered Therapy ?Motivational Interviewing ? ? ? ?Kenna Gilbert Oakes Mccready, LCSWA ?

## 2021-10-13 NOTE — Plan of Care (Signed)
?  D: Patient alert and oriented, able to make needs known. Denies SI/HI, AVH at present. Denies pain at present. Patient goal today "getting to go home." Patient reports energy level as within normal limits. She reports she slept okay last night. Patient does not request any PRN medication at this time. She reports her pain in her hand/arm as a 1/10 but does not want any pain medication at present. ? ?A: Scheduled medications administered to patient per MD order. Support and encouragement provided. Routine safety checks conducted every fifteen minutes. Patient informed to notify staff with problems or concerns. Frequent verbal contact made.  ? ?R: No adverse drug reactions noted. Patient contracts for safety at this time. Patient is compliant with medications and treatment plan. Patient receptive, calm and cooperative. Patient interacts with others appropriately on unit at present. Patient remains safe at present.  ? ? ? ?Problem: Education: ?Goal: Knowledge of [facility] Education information/materials will improve ?Outcome: Progressing ?Goal: Emotional status will improve ?Outcome: Progressing ?Goal: Mental status will improve ?Description: Pt is progressing.  ?Outcome: Progressing ?Goal: Verbalization of understanding the information provided will improve ?Outcome: Progressing ?  ?Problem: Activity: ?Goal: Interest or engagement in activities will improve ?Outcome: Progressing ?Goal: Sleeping patterns will improve ?Outcome: Progressing ?  ?Problem: Coping: ?Goal: Ability to verbalize frustrations and anger appropriately will improve ?Outcome: Progressing ?Goal: Ability to demonstrate self-control will improve ?Outcome: Progressing ?  ?Problem: Health Behavior/Discharge Planning: ?Goal: Identification of resources available to assist in meeting health care needs will improve ?Outcome: Progressing ?Goal: Compliance with treatment plan for underlying cause of condition will improve ?Outcome: Progressing ?   ?Problem: Physical Regulation: ?Goal: Ability to maintain clinical measurements within normal limits will improve ?Outcome: Progressing ?  ?Problem: Safety: ?Goal: Periods of time without injury will increase ?Outcome: Progressing ?  ?Problem: Education: ?Goal: Utilization of techniques to improve thought processes will improve ?Outcome: Progressing ?Goal: Knowledge of the prescribed therapeutic regimen will improve ?Outcome: Progressing ?  ?Problem: Activity: ?Goal: Interest or engagement in leisure activities will improve ?Outcome: Progressing ?Goal: Imbalance in normal sleep/wake cycle will improve ?Outcome: Progressing ?  ?Problem: Coping: ?Goal: Coping ability will improve ?Outcome: Progressing ?Goal: Will verbalize feelings ?Outcome: Progressing ?  ?Problem: Health Behavior/Discharge Planning: ?Goal: Ability to make decisions will improve ?Outcome: Progressing ?Goal: Compliance with therapeutic regimen will improve ?Outcome: Progressing ?  ?Problem: Role Relationship: ?Goal: Will demonstrate positive changes in social behaviors and relationships ?Outcome: Progressing ?  ?Problem: Safety: ?Goal: Ability to disclose and discuss suicidal ideas will improve ?Outcome: Progressing ?Goal: Ability to identify and utilize support systems that promote safety will improve ?Outcome: Progressing ?  ?Problem: Self-Concept: ?Goal: Will verbalize positive feelings about self ?Outcome: Progressing ?Goal: Level of anxiety will decrease ?Outcome: Progressing ?  ?

## 2021-10-13 NOTE — Progress Notes (Signed)
Patient better, brighter affect. Patient slept soundly through the night. Blood pressure continues to be low. Registered 97/37 at this mornings reading.  ? ? ? ? ? ? ?C Butler-Nicholson, LPN ?

## 2021-10-13 NOTE — Progress Notes (Signed)
[facility] MD Progress Note ? ?10/13/2021 1:29 PM ?Elvin So  ?MRN:  [id number] ?Subjective:  pt seen and chart reviewed. She is calm and pleasant but guarded. She denied hearing voices of "Satan".  She denied SI.  She said that she wants to continue [place] with Dr. Kayleen Memos in [place].  ? ?She tolerates meds well.  ? ?Principal Problem: Severe recurrent major depression without psychotic features ([place]) ?Diagnosis: Principal Problem: ?  Severe recurrent major depression without psychotic features ([place]) ? ?Total Time spent with patient: 20 minutes ? ?Past Psychiatric History: Patient has a long history of recurrent severe depression.  She has had suicide attempts in the past.  Patient used to be a regular ECT patient but stopped receiving ECT about a decade ago.  She has had hospitalizations here at our facility as recently as last summer.  Harbor Paster does see an outpatient provider in the community.  It does not sound like mania has been part of the picture.  She said that she wants to do [place] with Dr. Kayleen Memos in [place].  ? ?Past Medical History:  ?Past Medical History:  ?Diagnosis Date  ? Anemia   ? Anxiety   ? Depression   ? Ovarian cyst   ?  ?Past Surgical History:  ?Procedure Laterality Date  ? ECT TREATMENTS    ? ROBOTIC ASSISTED BILATERAL SALPINGO OOPHERECTOMY Left 10/30/2020  ? Procedure: XI ROBOTIC ASSISTED SALPINGO OOPHORECTOMY;  Surgeon: Benjaman Kindler, MD;  Location: [facility] ORS;  Service: Gynecology;  Laterality: Left;  ? ROBOTIC ASSISTED LAPAROSCOPIC OVARIAN CYSTECTOMY Bilateral 10/30/2020  ? Procedure: XI ROBOTIC ASSISTED LAPAROSCOPIC OVARIAN CYSTECTOMY;  Surgeon: Benjaman Kindler, MD;  Location: [facility] ORS;  Service: Gynecology;  Laterality: Bilateral;  ? TUBAL LIGATION  2002  ? XI ROBOTIC ASSISTED SALPINGECTOMY Right 10/30/2020  ? Procedure: XI ROBOTIC ASSISTED SALPINGECTOMY;  Surgeon: Benjaman Kindler, MD;  Location: [facility] ORS;  Service: Gynecology;  Laterality: Right;  ? ?Family History:  ?Family History  ?Problem  Relation Age of Onset  ? Breast cancer Neg Hx   ? ?Family Psychiatric  History: none ?Social History:  ?Social History  ? ?Substance and Sexual Activity  ?Alcohol Use Yes  ? Comment: occassional  ?   ?Social History  ? ?Substance and Sexual Activity  ?Drug Use Never  ?  ?Social History  ? ?Socioeconomic History  ? Marital status: Married  ?  Spouse name: Herbie Baltimore  ? Number of children: Not on file  ? Years of education: Not on file  ? Highest education level: Not on file  ?Occupational History  ? Not on file  ?Tobacco Use  ? Smoking status: Never  ? Smokeless tobacco: Never  ?Vaping Use  ? Vaping Use: Never used  ?Substance and Sexual Activity  ? Alcohol use: Yes  ?  Comment: occassional  ? Drug use: Never  ? Sexual activity: Not on file  ?Other Topics Concern  ? Not on file  ?Social History Narrative  ? Not on file  ? ?Social Determinants of Health  ? ?Financial Resource Strain: Not on file  ?Food Insecurity: Not on file  ?Transportation Needs: Not on file  ?Physical Activity: Not on file  ?Stress: Not on file  ?Social Connections: Not on file  ? ?Additional Social History:  ? ? ?Sleep: Fair ? ?Appetite:  Fair ? ?Current Medications: ?Current Facility-Administered Medications  ?Medication Dose Route Frequency Provider Last Rate Last Admin  ? acetaminophen (TYLENOL) tablet 650 mg  650 mg Oral Q6H PRN Clapacs, Madie Reno, MD      ?  alum & mag hydroxide-simeth (MAALOX/MYLANTA) 200-200-20 MG/5ML suspension 30 mL  30 mL Oral Q4H PRN Clapacs, John T, MD      ? ARIPiprazole (ABILIFY) tablet 5 mg  5 mg Oral Daily Clapacs, Madie Reno, MD   5 mg at 10/13/21 0816  ? buPROPion (WELLBUTRIN XL) 24 hr tablet 300 mg  300 mg Oral Daily Clapacs, John T, MD   300 mg at 10/13/21 3383  ? hydrOXYzine (ATARAX) tablet 50 mg  50 mg Oral TID PRN Clapacs, Madie Reno, MD   50 mg at 10/11/21 2125  ? magnesium hydroxide (MILK OF MAGNESIA) suspension 30 mL  30 mL Oral Daily PRN Clapacs, Madie Reno, MD      ? traZODone (DESYREL) tablet 100 mg  100 mg Oral QHS PRN  Clapacs, Madie Reno, MD   100 mg at 10/12/21 2114  ? vitamin B-12 (CYANOCOBALAMIN) tablet 500 mcg  500 mcg Oral Daily Clapacs, Madie Reno, MD   500 mcg at 10/13/21 2919  ? ? ?Lab Results: No results found for this or any previous visit (from the past 48 hour(s)). ? ?Blood Alcohol level:  ?Lab Results  ?Component Value Date  ? ETH <10 10/05/2021  ? ETH <10 03/25/2021  ? ? ?Metabolic Disorder Labs: ?Lab Results  ?Component Value Date  ? HGBA1C 5.2 03/27/2021  ? MPG 103 03/27/2021  ? ?No results found for: PROLACTIN ?Lab Results  ?Component Value Date  ? CHOL 162 03/27/2021  ? TRIG 160 (H) 03/27/2021  ? HDL 34 (L) 03/27/2021  ? CHOLHDL 4.8 03/27/2021  ? VLDL 32 03/27/2021  ? [place] 96 03/27/2021  ? ? ?Physical Findings: ?AIMS:  , ,  ,  ,    ?CIWA:    ?COWS:    ? ?Musculoskeletal: ?Strength & Muscle Tone: within normal limits ?Gait & Station: normal ?Patient leans: N/A ? ?Psychiatric Specialty Exam: ? ?Presentation  ?General Appearance: Appropriate for Environment; Casual ? ?Eye Contact:Good ? ?Speech:Clear and Coherent ? ?Speech Volume:Normal ? ?Handedness:Right ? ? ?Mood and Affect  ?Mood:Euthymic ? ?Affect:Appropriate; Congruent ? ? ?Thought Process  ?Thought Processes:Goal Directed ? ?Descriptions of Associations:Circumstantial ? ?Orientation:Full (Time, Place and Person) ? ?Thought Content:Logical ? ?History of Schizophrenia/Schizoaffective disorder:No ? ?Duration of Psychotic Symptoms:N/A ? ?Hallucinations:No data recorded ?Ideas of Reference:None ? ?Suicidal Thoughts:No data recorded ?Homicidal Thoughts:No data recorded ? ?Sensorium  ?Memory:Immediate Good; Recent Good ? ?Judgment:Fair ? ?Insight:Fair ? ? ?Executive Functions  ?Concentration:Good ? ?Attention Span:Good ? ?Recall:Good ? ?Fund of [place] ? ?Language:Good ? ? ?Psychomotor Activity  ?Psychomotor Activity:No data recorded ? ?Assets  ?Assets:Resilience; Armed forces logistics/support/administrative officer; Desire for Improvement; Social Support; Housing ? ? ?Sleep  ?Sleep:No data  recorded ? ? ?Physical Exam: ?Physical Exam ?ROS ?Blood pressure (!) 115/58, pulse 77, temperature 98.2 ?F (36.8 ?C), temperature source Oral, resp. rate 18, height '5\' 4"'$  (1.626 m), weight 74.8 kg, SpO2 99 %. Body mass index is 28.32 kg/m?. ? ? ?Treatment Plan Summary: ?Daily contact with patient to assess and evaluate symptoms and progress in treatment and Medication management ? ?MDD, with psychotic features ?-- continue wellbutrin XL '150mg'$ daily.  ?-- continue abilify '5mg'$  daiy.  ?-- HOLD ECT for now.  ?-- pt is interested in [place] as outpatient.  ? ?Niomie Englert, MD ?10/13/2021, 1:29 PM ? ?

## 2021-10-14 NOTE — Plan of Care (Signed)
D: Pt alert and oriented. Pt rates depression 1/10, hopelessness 0/10, and anxiety 0/10. Pt goal: "Getting well and going home." Pt reports energy level as normal and concentration as being good. Pt reports sleep last night as being good. Pt did receive medications for sleep and did find them helpful. Pt denies experiencing any pain at this time. Pt denies experiencing any SI/HI, or AVH at this time.  ? ?A: Scheduled medications administered to pt, per MD orders. Support and encouragement provided. Frequent verbal contact made. Routine safety checks conducted q15 minutes.  ? ?R: No adverse drug reactions noted. Pt verbally contracts for safety at this time. Pt compliant with medications and treatment plan. Pt interacts well with others on the unit. Pt remains safe at this time. Will continue to monitor.  ?Problem: Education: ?Goal: Emotional status will improve ?Outcome: Progressing ?  ?Problem: Activity: ?Goal: Interest or engagement in activities will improve ?Outcome: Progressing ?  ?

## 2021-10-14 NOTE — Plan of Care (Signed)
?  Problem: Education: ?Goal: Knowledge of [place] [facility] Education information/materials will improve ?Outcome: Progressing ?Goal: Emotional status will improve ?Outcome: Progressing ?Goal: Mental status will improve ?Description: Pt is progressing.  ?Outcome: Progressing ?Goal: Verbalization of understanding the information provided will improve ?Outcome: Progressing ?  ?Problem: Activity: ?Goal: Interest or engagement in activities will improve ?Outcome: Progressing ?Goal: Sleeping patterns will improve ?Outcome: Progressing ?  ?Problem: Coping: ?Goal: Ability to verbalize frustrations and anger appropriately will improve ?Outcome: Progressing ?Goal: Ability to demonstrate self-control will improve ?Outcome: Progressing ?  ?Problem: Health Behavior/Discharge Planning: ?Goal: Identification of resources available to assist in meeting health care needs will improve ?Outcome: Progressing ?Goal: Compliance with treatment plan for underlying cause of condition will improve ?Outcome: Progressing ?  ?Problem: Physical Regulation: ?Goal: Ability to maintain clinical measurements within normal limits will improve ?Outcome: Progressing ?  ?Problem: Safety: ?Goal: Periods of time without injury will increase ?Outcome: Progressing ?  ?Problem: Education: ?Goal: Utilization of techniques to improve thought processes will improve ?Outcome: Progressing ?Goal: Knowledge of the prescribed therapeutic regimen will improve ?Outcome: Progressing ?  ?Problem: Activity: ?Goal: Interest or engagement in leisure activities will improve ?Outcome: Progressing ?Goal: Imbalance in normal sleep/wake cycle will improve ?Outcome: Progressing ?  ?Problem: Coping: ?Goal: Coping ability will improve ?Outcome: Progressing ?Goal: Will verbalize feelings ?Outcome: Progressing ?  ?Problem: Health Behavior/Discharge Planning: ?Goal: Ability to make decisions will improve ?Outcome: Progressing ?Goal: Compliance with therapeutic regimen will  improve ?Outcome: Progressing ?  ?Problem: Role Relationship: ?Goal: Will demonstrate positive changes in social behaviors and relationships ?Outcome: Progressing ?  ?Problem: Safety: ?Goal: Ability to disclose and discuss suicidal ideas will improve ?Outcome: Progressing ?Goal: Ability to identify and utilize support systems that promote safety will improve ?Outcome: Progressing ?  ?Problem: Self-Concept: ?Goal: Will verbalize positive feelings about self ?Outcome: Progressing ?Goal: Level of anxiety will decrease ?Outcome: Progressing ?  ?

## 2021-10-14 NOTE — Group Note (Signed)
LCSW Group Therapy Note ? ?Group Date: 10/14/2021 ?Start Time: 1315 ?End Time: 4665 ? ? ?Type of Therapy and Topic:  Group Therapy - Healthy vs Unhealthy Coping Skills ? ?Participation Level:  Did Not Attend  ? ?Description of Group ?The focus of this group was to determine what unhealthy coping techniques typically are used by group members and what healthy coping techniques would be helpful in coping with various problems. Patients were guided in becoming aware of the differences between healthy and unhealthy coping techniques. Patients were asked to identify 2-3 healthy coping skills they would like to learn to use more effectively. ? ?Therapeutic Goals ?Patients learned that coping is what human beings do all day long to deal with various situations in their lives ?Patients defined and discussed healthy vs unhealthy coping techniques ?Patients identified their preferred coping techniques and identified whether these were healthy or unhealthy ?Patients determined 2-3 healthy coping skills they would like to become more familiar with and use more often. ?Patients provided support and ideas to each other ? ? ?Summary of Patient Progress: Due to limited staffing, group was not held on the unit.  ? ?Therapeutic Modalities ?Cognitive Behavioral Therapy ?Motivational Interviewing ? ?Kenna Gilbert Indalecio Malmstrom, LCSWA ?10/14/2021  3:14 PM   ?

## 2021-10-14 NOTE — Progress Notes (Signed)
Patient has been calm and cooperative. Denies SI, HI and AVH ?

## 2021-10-14 NOTE — Progress Notes (Signed)
[facility] MD Progress Note ? ?10/14/2021 2:29 PM ?Leslie Elliott  ?MRN:  [id number] ?Subjective:  pt seen and chart reviewed.  ?She continues to reports feeling ok, and tolerates meds. No Si or HI.  ? ?She is not interested in ECT now, and wants to try [place] first.  ? ? ?Principal Problem: Severe recurrent major depression without psychotic features ([place]) ?Diagnosis: Principal Problem: ?  Severe recurrent major depression without psychotic features ([place]) ? ?Total Time spent with patient: 20 minutes ? ?Past Psychiatric History: Patient has a long history of recurrent severe depression.  She has had suicide attempts in the past.  Patient used to be a regular ECT patient but stopped receiving ECT about a decade ago.  She has had hospitalizations here at our facility as recently as last summer.  Leslie Elliott does see an outpatient provider in the community.  It does not sound like mania has been part of the picture.  She said that she wants to do [place] with Dr. Kayleen Memos in [place].  ? ?Past Medical History:  ?Past Medical History:  ?Diagnosis Date  ? Anemia   ? Anxiety   ? Depression   ? Ovarian cyst   ?  ?Past Surgical History:  ?Procedure Laterality Date  ? ECT TREATMENTS    ? ROBOTIC ASSISTED BILATERAL SALPINGO OOPHERECTOMY Left 10/30/2020  ? Procedure: XI ROBOTIC ASSISTED SALPINGO OOPHORECTOMY;  Surgeon: Benjaman Kindler, MD;  Location: [facility] ORS;  Service: Gynecology;  Laterality: Left;  ? ROBOTIC ASSISTED LAPAROSCOPIC OVARIAN CYSTECTOMY Bilateral 10/30/2020  ? Procedure: XI ROBOTIC ASSISTED LAPAROSCOPIC OVARIAN CYSTECTOMY;  Surgeon: Benjaman Kindler, MD;  Location: [facility] ORS;  Service: Gynecology;  Laterality: Bilateral;  ? TUBAL LIGATION  2002  ? XI ROBOTIC ASSISTED SALPINGECTOMY Right 10/30/2020  ? Procedure: XI ROBOTIC ASSISTED SALPINGECTOMY;  Surgeon: Benjaman Kindler, MD;  Location: [facility] ORS;  Service: Gynecology;  Laterality: Right;  ? ?Family History:  ?Family History  ?Problem Relation Age of Onset  ? Breast cancer Neg Hx   ? ?Family  Psychiatric  History: none ?Social History:  ?Social History  ? ?Substance and Sexual Activity  ?Alcohol Use Yes  ? Comment: occassional  ?   ?Social History  ? ?Substance and Sexual Activity  ?Drug Use Never  ?  ?Social History  ? ?Socioeconomic History  ? Marital status: Married  ?  Spouse name: Herbie [place]  ? Number of children: Not on file  ? Years of education: Not on file  ? Highest education level: Not on file  ?Occupational History  ? Not on file  ?Tobacco Use  ? Smoking status: Never  ? Smokeless tobacco: Never  ?Vaping Use  ? Vaping Use: Never used  ?Substance and Sexual Activity  ? Alcohol use: Yes  ?  Comment: occassional  ? Drug use: Never  ? Sexual activity: Not on file  ?Other Topics Concern  ? Not on file  ?Social History Narrative  ? Not on file  ? ?Social Determinants of Health  ? ?Financial Resource Strain: Not on file  ?Food Insecurity: Not on file  ?Transportation Needs: Not on file  ?Physical Activity: Not on file  ?Stress: Not on file  ?Social Connections: Not on file  ? ?Additional Social History:  ? ? ?Sleep: Fair ? ?Appetite:  Fair ? ?Current Medications: ?Current Facility-Administered Medications  ?Medication Dose Route Frequency Provider Last Rate Last Admin  ? acetaminophen (TYLENOL) tablet 650 mg  650 mg Oral Q6H PRN Clapacs, Madie Reno, MD   650 mg at 10/13/21 2117  ?  alum & mag hydroxide-simeth (MAALOX/MYLANTA) 200-200-20 MG/5ML suspension 30 mL  30 mL Oral Q4H PRN Clapacs, John T, MD      ? ARIPiprazole (ABILIFY) tablet 5 mg  5 mg Oral Daily Clapacs, Madie Reno, MD   5 mg at 10/14/21 0848  ? buPROPion (WELLBUTRIN XL) 24 hr tablet 300 mg  300 mg Oral Daily Clapacs, John T, MD   300 mg at 10/14/21 0848  ? hydrOXYzine (ATARAX) tablet 50 mg  50 mg Oral TID PRN Clapacs, Madie Reno, MD   50 mg at 10/11/21 2125  ? magnesium hydroxide (MILK OF MAGNESIA) suspension 30 mL  30 mL Oral Daily PRN Clapacs, Madie Reno, MD      ? traZODone (DESYREL) tablet 100 mg  100 mg Oral QHS PRN Clapacs, Madie Reno, MD   100 mg at  10/13/21 2117  ? vitamin B-12 (CYANOCOBALAMIN) tablet 500 mcg  500 mcg Oral Daily Clapacs, Madie Reno, MD   500 mcg at 10/14/21 0848  ? ? ?Lab Results: No results found for this or any previous visit (from the past 48 hour(s)). ? ?Blood Alcohol level:  ?Lab Results  ?Component Value Date  ? ETH <10 10/05/2021  ? ETH <10 03/25/2021  ? ? ?Metabolic Disorder Labs: ?Lab Results  ?Component Value Date  ? HGBA1C 5.2 03/27/2021  ? MPG 103 03/27/2021  ? ?No results found for: PROLACTIN ?Lab Results  ?Component Value Date  ? CHOL 162 03/27/2021  ? TRIG 160 (H) 03/27/2021  ? HDL 34 (L) 03/27/2021  ? CHOLHDL 4.8 03/27/2021  ? VLDL 32 03/27/2021  ? [place] 96 03/27/2021  ? ? ?Physical Findings: ?AIMS:  , ,  ,  ,    ?CIWA:    ?COWS:    ? ?Musculoskeletal: ?Strength & Muscle Tone: within normal limits ?Gait & Station: normal ?Patient leans: N/A ? ?Psychiatric Specialty Exam: ? ?Presentation  ?General Appearance: Appropriate for Environment; Casual ? ?Eye Contact:Good ? ?Speech:Clear and Coherent ? ?Speech Volume:Normal ? ?Handedness:Right ? ? ?Mood and Affect  ?Mood:Euthymic ? ?Affect:Appropriate; Congruent ? ? ?Thought Process  ?Thought Processes:Goal Directed ? ?Descriptions of Associations:Circumstantial ? ?Orientation:Full (Time, Place and Person) ? ?Thought Content:Logical ? ?History of Schizophrenia/Schizoaffective disorder:No ? ?Duration of Psychotic Symptoms:N/A ? ?Hallucinations:No data recorded ?Ideas of Reference:None ? ?Suicidal Thoughts:No data recorded ?Homicidal Thoughts:No data recorded ? ?Sensorium  ?Memory:Immediate Good; Recent Good ? ?Judgment:Fair ? ?Insight:Fair ? ? ?Executive Functions  ?Concentration:Good ? ?Attention Span:Good ? ?Recall:Good ? ?Fund of [place] ? ?Language:Good ? ? ?Psychomotor Activity  ?Psychomotor Activity:No data recorded ? ?Assets  ?Assets:Resilience; Armed forces logistics/support/administrative officer; Desire for Improvement; Social Support; Housing ? ? ?Sleep  ?Sleep:No data recorded ? ? ?Physical  Exam: ?Physical Exam ?Psychiatric:     ?   Attention and Perception: Attention normal.     ?   Mood and Affect: Mood is depressed. Affect is blunt.     ?   Speech: Speech normal.     ?   Behavior: Behavior is cooperative.     ?   Thought Content: Thought content normal.     ?   Cognition and Memory: Cognition normal.     ?   Judgment: Judgment normal.  ? ?ROS ?Blood pressure (!) 104/54, pulse (!) 58, temperature 98.3 ?F (36.8 ?C), temperature source Oral, resp. rate 18, height '5\' 4"'$  (1.626 m), weight 74.8 kg, SpO2 100 %. Body mass index is 28.32 kg/m?. ? ? ?Treatment Plan Summary: ?Daily contact with patient to assess and evaluate symptoms and progress  in treatment and Medication management ? ?MDD, with psychotic features ?-- continue wellbutrin XL '150mg'$ daily.  ?-- continue abilify '5mg'$  daiy.  ?-- HOLD ECT for now.  ?-- pt is interested in [place] as outpatient.  ? ?Jerrie Gullo, MD ?10/14/2021, 2:29 PM ? ?

## 2021-10-14 NOTE — Progress Notes (Signed)
Pt isolates to her room, no interaction with peers or staff. She denies SI/HI and AVH. She reports she is her because she "took some pills." Pt would not expand on the reason she did this and she was just matter of fact. Pt states she live at home with her husband and two kids. No complaints of pain at the lt wrist from an IV infiltration from another unit.  ?

## 2021-10-14 NOTE — Plan of Care (Signed)
?  Problem: Education: ?Goal: Knowledge of [place] [facility] Education information/materials will improve ?Outcome: Progressing ?Goal: Emotional status will improve ?Outcome: Progressing ?Goal: Mental status will improve ?Description: Pt is progressing.  ?Outcome: Progressing ?Goal: Verbalization of understanding the information provided will improve ?Outcome: Progressing ?  ?Problem: Activity: ?Goal: Interest or engagement in activities will improve ?Outcome: Progressing ?Goal: Sleeping patterns will improve ?Outcome: Progressing ?  ?Problem: Coping: ?Goal: Ability to verbalize frustrations and anger appropriately will improve ?Outcome: Progressing ?Goal: Ability to demonstrate self-control will improve ?Outcome: Progressing ?  ?Problem: Health Behavior/Discharge Planning: ?Goal: Identification of resources available to assist in meeting health care needs will improve ?Outcome: Progressing ?Goal: Compliance with treatment plan for underlying cause of condition will improve ?Outcome: Progressing ?  ?Problem: Physical Regulation: ?Goal: Ability to maintain clinical measurements within normal limits will improve ?Outcome: Progressing ?  ?Problem: Safety: ?Goal: Periods of time without injury will increase ?Outcome: Progressing ?  ?Problem: Activity: ?Goal: Interest or engagement in leisure activities will improve ?Outcome: Progressing ?Goal: Imbalance in normal sleep/wake cycle will improve ?Outcome: Progressing ?  ?Problem: Coping: ?Goal: Coping ability will improve ?Outcome: Progressing ?Goal: Will verbalize feelings ?Outcome: Progressing ?  ?Problem: Health Behavior/Discharge Planning: ?Goal: Ability to make decisions will improve ?Outcome: Progressing ?Goal: Compliance with therapeutic regimen will improve ?Outcome: Progressing ?  ?Problem: Role Relationship: ?Goal: Will demonstrate positive changes in social behaviors and relationships ?Outcome: Progressing ?  ?Problem: Safety: ?Goal: Ability to disclose and  discuss suicidal ideas will improve ?Outcome: Progressing ?Goal: Ability to identify and utilize support systems that promote safety will improve ?Outcome: Progressing ?  ?Problem: Self-Concept: ?Goal: Will verbalize positive feelings about self ?Outcome: Progressing ?Goal: Level of anxiety will decrease ?Outcome: Progressing ?  ?

## 2021-10-15 MED ORDER — TRAZODONE HCL 100 MG PO TABS
100.0000 mg | ORAL_TABLET | Freq: Every evening | ORAL | 0 refills | Status: AC | PRN
Start: 2021-10-15 — End: ?

## 2021-10-15 MED ORDER — ARIPIPRAZOLE 5 MG PO TABS: 5.0000 mg | ORAL_TABLET | Freq: Every day | ORAL | 0 refills | Status: AC

## 2021-10-15 MED ORDER — CYANOCOBALAMIN 500 MCG PO TABS: 500.0000 ug | ORAL_TABLET | Freq: Every day | ORAL | 0 refills | Status: AC

## 2021-10-15 MED ORDER — BUPROPION HCL ER (XL) 300 MG PO TB24: 300.0000 mg | ORAL_TABLET | Freq: Every day | ORAL | 0 refills | Status: AC

## 2021-10-15 MED ORDER — HYDROXYZINE HCL 50 MG PO TABS: 50.0000 mg | ORAL_TABLET | Freq: Three times a day (TID) | ORAL | 0 refills | Status: AC | PRN

## 2021-10-15 NOTE — Progress Notes (Signed)
Recreation Therapy Notes ? ?INPATIENT RECREATION TR PLAN ? ?Patient Details ?Name: Leslie Elliott ?MRN: [id number] ?DOB: 1976-05-28 ?Today's Date: 10/15/2021 ? ?Rec Therapy Plan ?Is patient appropriate for Therapeutic Recreation?: Yes ?Treatment times per week: at least 3 ?Estimated Length of Stay: 5-7 days ?TR Treatment/Interventions: Group participation (Comment) ? ?Discharge Criteria ?Pt will be discharged from therapy if:: Discharged ?Treatment plan/goals/alternatives discussed and agreed upon by:: Patient/family ? ?Discharge Summary ?Short term goals set: Patient will identify 3 positive coping skills strategies to use post d/c within 5 recreation therapy group sessions ?Short term goals met: Adequate for discharge ?Progress toward goals comments: Groups attended ?Which groups?: Leisure education, Other (Comment) (Self-care) ?Reason goals not met: N/A ?Therapeutic equipment acquired: N/A ?Reason patient discharged from therapy: Discharge from hospital ?Pt/family agrees with progress & goals achieved: Yes ?Date patient discharged from therapy: 10/15/21 ? ? ?Josehua Hammar ?10/15/2021, 12:14 PM ?

## 2021-10-15 NOTE — [facility] Suicide Risk Assessment (Signed)
[facility] INPATIENT:  Family/Significant Other Suicide Prevention Education ? ?Suicide Prevention Education:  ?Education Completed; Jacquelina Hewins, husband, [phone] has been identified by the patient as the family member/significant other with whom the patient will be residing, and identified as the person(s) who will aid the patient in the event of a mental health crisis (suicidal ideations/suicide attempt).  With written consent from the patient, the family member/significant other has been provided the following suicide prevention education, prior to the and/or following the discharge of the patient. ? ?The suicide prevention education provided includes the following: ?Suicide risk factors ?Suicide prevention and interventions ?National Suicide Hotline telephone number ?[facility] assessment telephone number ?[facility] Emergency Assistance 911 ?[place] and/or Residential Mobile Crisis Unit telephone number ? ?Request made of family/significant other to: ?Remove weapons (e.g., guns, rifles, knives), all items previously/currently identified as safety concern.   ?Remove drugs/medications (over-the-counter, prescriptions, illicit drugs), all items previously/currently identified as a safety concern. ? ?The family member/significant other verbalizes understanding of the suicide prevention education information provided.  The family member/significant other agrees to remove the items of safety concern listed above. ? ?Husband reports that "I'm not a 100% sure that her medicine was at the level it should have been" when asked for perspective on what led to patient's admission.  He reports that patient has a history of being in and out of the hospital.  He reports that patient has had some instability with finding a consistent medication provider due to providers retiring. He reports that patient does not have access to his weapons.  He reports that she is NOT a danger to self or others.   ? ?Rozann Lesches ?10/15/2021, 11:40 AM ?

## 2021-10-15 NOTE — Progress Notes (Signed)
Recreation Therapy Notes ? ?Date: 10/15/2021 ? ?Time: 10:50 am   ? ?Location: Craft room   ? ?Behavioral response: Appropriate ? ?Intervention Topic:  Self-care   ? ?Discussion/Intervention:  ?Group content today was focused on Self-Care. The group defined self-care and some positive ways they care for themselves. Individuals expressed ways and reasons why they neglected any self-care in the past. Patients described ways to improve self-care in the future. The group explained what could happen if they did not do any self-care activities at all. The group participated in the intervention ?self-care assessment? where they had a chance to discover some of their weaknesses and strengths in self- care. Patient came up with a self-care plan to improve themselves in the future.  ?Clinical Observations/Feedback: ?Patient came to group and explained that she manages her self-care by paying attention to her feelings. She stated that she could improve self-care by asking for help. Individual was social with staff and peers while participating in the intervention.  ?Leslie Elliott LRT/CTRS  ? ? ? ? ? ? ? ?Leslie Elliott ?10/15/2021 12:07 PM ?

## 2021-10-15 NOTE — Progress Notes (Signed)
Discharge Note: ? ?D. Pt alert and oriented x 3. Denies SI and HI/A/VH at present.  Compliant with  medications. Pt assessed by MD. Discharged home as ordered.    ? ?A.  Emotional Support offered Patient and encouragement provided. All belongings from locker  returned to Patient at the time of discharge. Discharge instructions reveiwed with Patient. Suicide information given and discussed with Patient who stated he understood and had no question. ? ?R.  Pt verbalized understanding related to discharge instructions. Pt signed belonging sheet in agreement with items received from locker. Denies concerns at this time ambulatory with steady gait. Appears to be in no physical distress.  ?

## 2021-10-15 NOTE — Plan of Care (Signed)
?  Problem: Education: ?Goal: Knowledge of [facility] Education information/materials will improve ?Outcome: Adequate for Discharge ?Goal: Emotional status will improve ?Outcome: Adequate for Discharge ?Goal: Mental status will improve ?Description: Pt is progressing.  ?Outcome: Adequate for Discharge ?Goal: Verbalization of understanding the information provided will improve ?Outcome: Adequate for Discharge ?  ?Problem: Activity: ?Goal: Interest or engagement in activities will improve ?Outcome: Adequate for Discharge ?Goal: Sleeping patterns will improve ?Outcome: Adequate for Discharge ?  ?Problem: Coping: ?Goal: Ability to verbalize frustrations and anger appropriately will improve ?Outcome: Adequate for Discharge ?Goal: Ability to demonstrate self-control will improve ?Outcome: Adequate for Discharge ?  ?Problem: Health Behavior/Discharge Planning: ?Goal: Identification of resources available to assist in meeting health care needs will improve ?Outcome: Adequate for Discharge ?Goal: Compliance with treatment plan for underlying cause of condition will improve ?Outcome: Adequate for Discharge ?  ?Problem: Physical Regulation: ?Goal: Ability to maintain clinical measurements within normal limits will improve ?Outcome: Adequate for Discharge ?  ?Problem: Safety: ?Goal: Periods of time without injury will increase ?Outcome: Adequate for Discharge ?  ?Problem: Education: ?Goal: Utilization of techniques to improve thought processes will improve ?Outcome: Adequate for Discharge ?Goal: Knowledge of the prescribed therapeutic regimen will improve ?Outcome: Adequate for Discharge ?  ?Problem: Activity: ?Goal: Interest or engagement in leisure activities will improve ?Outcome: Adequate for Discharge ?Goal: Imbalance in normal sleep/wake cycle will improve ?Outcome: Adequate for Discharge ?  ?Problem: Coping: ?Goal: Coping ability will improve ?Outcome: Adequate for Discharge ?Goal: Will verbalize  feelings ?Outcome: Adequate for Discharge ?  ?Problem: Health Behavior/Discharge Planning: ?Goal: Ability to make decisions will improve ?Outcome: Adequate for Discharge ?Goal: Compliance with therapeutic regimen will improve ?Outcome: Adequate for Discharge ?  ?Problem: Role Relationship: ?Goal: Will demonstrate positive changes in social behaviors and relationships ?Outcome: Adequate for Discharge ?  ?Problem: Safety: ?Goal: Ability to disclose and discuss suicidal ideas will improve ?Outcome: Adequate for Discharge ?Goal: Ability to identify and utilize support systems that promote safety will improve ?Outcome: Adequate for Discharge ?  ?Problem: Self-Concept: ?Goal: Will verbalize positive feelings about self ?Outcome: Adequate for Discharge ?Goal: Level of anxiety will decrease ?Outcome: Adequate for Discharge ?  ?Problem: Coping Skills ?Goal: STG - Patient will identify 3 positive coping skills strategies to use post d/c within 5 recreation therapy group sessions ?Description: STG - Patient will identify 3 positive coping skills strategies to use post d/c within 5 recreation therapy group sessions ?Outcome: Adequate for Discharge ?  ?

## 2021-10-15 NOTE — Progress Notes (Signed)
?  [facility] Adult Case Management Discharge Plan : ? ?Will you be returning to the same living situation after discharge:  Yes,  pt reports that she is returning home. ?At discharge, do you have transportation home?: Yes,  pt reports that her husband will pick her up. ?Do you have the ability to pay for your medications: Yes,  BCBS ? ?Release of information consent forms completed and in the chart;  Patient's signature needed at discharge. ? ?Patient to Follow up at: ? Follow-up Information   ? ? [facility] Follow up.   ?Why: Appointment is scheduled for 10/16/2021 at 11AM. ?Contact information: ?[address] ?[place] [address] ?[phone] ? ? ?  ?  ? ?  ?  ? ?  ? ? ?Next level of care provider has access to [place] ? ?Safety Planning and Suicide Prevention discussed: Yes,  SPE completed with the patient's husband ? ?  ? ?Has patient been referred to the Quitline?: Patient refused referral ? ?Patient has been referred for addiction treatment: Pt. refused referral ? ?Rozann Lesches, LCSW ?10/15/2021, 1:21 PM ?

## 2021-10-15 NOTE — Discharge Summary (Signed)
Physician Discharge Summary Note ? ?Patient:  Leslie Elliott is an 46 y.o., female ?MRN:  [id number] ?DOB:  07/18/1976 ?Patient phone:  [phone] (home)  ?Patient address:   ?[address] ?[place] [address],  ?Total Time spent with patient: 30 minutes ? ?Date of Admission:  10/11/2021 ?Date of Discharge: 10/15/2021 ? ?Reason for Admission: Patient was admitted to the hospital after a suicide attempt requiring medical stabilization.  Transferred to [place] from [facility] and there were no beds at behavioral health.  Patient has a history of recurrent severe depression with psychotic features. ? ?Principal Problem: Severe recurrent major depression without psychotic features ([place]) ?Discharge Diagnoses: Principal Problem: ?  Severe recurrent major depression without psychotic features ([place]) ? ? ?Past Psychiatric History: Patient has a past history of psychotic depression possible bipolar disorder ? ?Past Medical History:  ?Past Medical History:  ?Diagnosis Date  ? Anemia   ? Anxiety   ? Depression   ? Ovarian cyst   ?  ?Past Surgical History:  ?Procedure Laterality Date  ? ECT TREATMENTS    ? ROBOTIC ASSISTED BILATERAL SALPINGO OOPHERECTOMY Left 10/30/2020  ? Procedure: XI ROBOTIC ASSISTED SALPINGO OOPHORECTOMY;  Surgeon: Benjaman Kindler, MD;  Location: [facility] ORS;  Service: Gynecology;  Laterality: Left;  ? ROBOTIC ASSISTED LAPAROSCOPIC OVARIAN CYSTECTOMY Bilateral 10/30/2020  ? Procedure: XI ROBOTIC ASSISTED LAPAROSCOPIC OVARIAN CYSTECTOMY;  Surgeon: Benjaman Kindler, MD;  Location: [facility] ORS;  Service: Gynecology;  Laterality: Bilateral;  ? TUBAL LIGATION  2002  ? XI ROBOTIC ASSISTED SALPINGECTOMY Right 10/30/2020  ? Procedure: XI ROBOTIC ASSISTED SALPINGECTOMY;  Surgeon: Benjaman Kindler, MD;  Location: [facility] ORS;  Service: Gynecology;  Laterality: Right;  ? ?Family History:  ?Family History  ?Problem Relation Age of Onset  ? Breast cancer Neg Hx   ? ?Family Psychiatric  History: See previous ?Social History:   ?Social History  ? ?Substance and Sexual Activity  ?Alcohol Use Yes  ? Comment: occassional  ?   ?Social History  ? ?Substance and Sexual Activity  ?Drug Use Never  ?  ?Social History  ? ?Socioeconomic History  ? Marital status: Married  ?  Spouse name: Herbie [place]  ? Number of children: Not on file  ? Years of education: Not on file  ? Highest education level: Not on file  ?Occupational History  ? Not on file  ?Tobacco Use  ? Smoking status: Never  ? Smokeless tobacco: Never  ?Vaping Use  ? Vaping Use: Never used  ?Substance and Sexual Activity  ? Alcohol use: Yes  ?  Comment: occassional  ? Drug use: Never  ? Sexual activity: Not on file  ?Other Topics Concern  ? Not on file  ?Social History Narrative  ? Not on file  ? ?Social Determinants of Health  ? ?Financial Resource Strain: Not on file  ?Food Insecurity: Not on file  ?Transportation Needs: Not on file  ?Physical Activity: Not on file  ?Stress: Not on file  ?Social Connections: Not on file  ? ? ?Hospital Course: Patient admitted to psychiatric ward.  15-minute checks continued.  Patient did not display any dangerous behavior on the unit.  She denied any suicidal ideation.  Denied that she was having any hallucinations.  She was cooperative with medication and treatment.  Took care of her ADLs well neatly dressed appropriate behavior.  Attended groups appropriately.  Patient at the time of discharge is denying any suicidal thought denying any paranoia denying any delusions.  Denies hallucinations and denies any thought at all of  harming herself.  She specifically discussed ECT here in the hospital and said that because she was feeling better she preferred not to go with ECT but would like to continue the attempt at transcranial magnetic stimulation.  She will continue speaking with her outpatient psychiatrist after discharge as well as her therapist.  Continue current medication.  Prescriptions provided.  Patient has an appropriate support system good insight and  is able to agree that if any symptoms hinting at suicidality recurs she will immediately inform family or professionals ? ?Physical Findings: ?AIMS:  , ,  ,  ,    ?CIWA:    ?COWS:    ? ?Musculoskeletal: ?Strength & Muscle Tone: within normal limits ?Gait & Station: normal ?Patient leans: N/A ? ? ?Psychiatric Specialty Exam: ? ?Presentation  ?General Appearance: Appropriate for Environment; Casual ? ?Eye Contact:Good ? ?Speech:Clear and Coherent ? ?Speech Volume:Normal ? ?Handedness:Right ? ? ?Mood and Affect  ?Mood:Euthymic ? ?Affect:Appropriate; Congruent ? ? ?Thought Process  ?Thought Processes:Goal Directed ? ?Descriptions of Associations:Circumstantial ? ?Orientation:Full (Time, Place and Person) ? ?Thought Content:Logical ? ?History of Schizophrenia/Schizoaffective disorder:No ? ?Duration of Psychotic Symptoms:N/A ? ?Hallucinations:No data recorded ?Ideas of Reference:None ? ?Suicidal Thoughts:No data recorded ?Homicidal Thoughts:No data recorded ? ?Sensorium  ?Memory:Immediate Good; Recent Good ? ?Judgment:Fair ? ?Insight:Fair ? ? ?Executive Functions  ?Concentration:Good ? ?Attention Span:Good ? ?Recall:Good ? ?Fund of [place] ? ?Language:Good ? ? ?Psychomotor Activity  ?Psychomotor Activity:No data recorded ? ?Assets  ?Assets:Resilience; Armed forces logistics/support/administrative officer; Desire for Improvement; Social Support; Housing ? ? ?Sleep  ?Sleep:No data recorded ? ? ?Physical Exam: ?Physical Exam ?Vitals and nursing note reviewed.  ?Constitutional:   ?   Appearance: Normal appearance.  ?HENT:  ?   Head: Normocephalic and atraumatic.  ?   Mouth/Throat:  ?   Pharynx: Oropharynx is clear.  ?Eyes:  ?   Pupils: Pupils are equal, round, and reactive to light.  ?Cardiovascular:  ?   Rate and Rhythm: Normal rate and regular rhythm.  ?Pulmonary:  ?   Effort: Pulmonary effort is normal.  ?   Breath sounds: Normal breath sounds.  ?Abdominal:  ?   General: Abdomen is flat.  ?   Palpations: Abdomen is soft.  ?Musculoskeletal:     ?    General: Normal range of motion.  ?Skin: ?   General: Skin is warm and dry.  ?Neurological:  ?   General: No focal deficit present.  ?   Mental Status: She is alert. Mental status is at baseline.  ?Psychiatric:     ?   Attention and Perception: Attention normal.     ?   Mood and Affect: Mood normal.     ?   Speech: Speech normal.     ?   Behavior: Behavior is slowed.     ?   Thought Content: Thought content normal.     ?   Cognition and Memory: Cognition normal.     ?   Judgment: Judgment normal.  ? ?Review of Systems  ?Constitutional: Negative.   ?HENT: Negative.    ?Eyes: Negative.   ?Respiratory: Negative.    ?Cardiovascular: Negative.   ?Gastrointestinal: Negative.   ?Musculoskeletal: Negative.   ?Skin: Negative.   ?Neurological: Negative.   ?Psychiatric/Behavioral: Negative.    ?Blood pressure (!) 98/58, pulse 63, temperature 98.7 ?F (37.1 ?C), temperature source Oral, resp. rate 18, height '5\' 4"'$  (1.626 m), weight 74.8 kg, SpO2 97 %. Body mass index is 28.32 kg/m?. ? ? ?Social History  ? ?Tobacco  Use  ?Smoking Status Never  ?Smokeless Tobacco Never  ? ?Tobacco Cessation:  N/A, patient does not currently use tobacco products ? ? ?Blood Alcohol level:  ?Lab Results  ?Component Value Date  ? ETH <10 10/05/2021  ? ETH <10 03/25/2021  ? ? ?Metabolic Disorder Labs:  ?Lab Results  ?Component Value Date  ? HGBA1C 5.2 03/27/2021  ? MPG 103 03/27/2021  ? ?No results found for: PROLACTIN ?Lab Results  ?Component Value Date  ? CHOL 162 03/27/2021  ? TRIG 160 (H) 03/27/2021  ? HDL 34 (L) 03/27/2021  ? CHOLHDL 4.8 03/27/2021  ? VLDL 32 03/27/2021  ? [place] 96 03/27/2021  ? ? ?See Psychiatric Specialty Exam and Suicide Risk Assessment completed by Attending Physician prior to discharge. ? ?Discharge destination:  Home ? ?Is patient on multiple antipsychotic therapies at discharge:  No   ?Has Patient had three or more failed trials of antipsychotic monotherapy by history:  No ? ?Recommended Plan for Multiple Antipsychotic  Therapies: ?NA ? ?Discharge Instructions   ? ? Diet - low sodium heart healthy   Complete by: As directed ?  ? Increase activity slowly   Complete by: As directed ?  ? ?  ? ?Allergies as of 10/15/2021   ? ?

## 2021-10-15 NOTE — [facility] Suicide Risk Assessment (Signed)
[facility] Discharge Suicide Risk Assessment ? ? ?Principal Problem: Severe recurrent major depression without psychotic features ([place]) ?Discharge Diagnoses: Principal Problem: ?  Severe recurrent major depression without psychotic features ([place]) ? ? ?Total Time spent with patient: 30 minutes ? ?Musculoskeletal: ?Strength & Muscle Tone: within normal limits ?Gait & Station: normal ?Patient leans: N/A ? ?Psychiatric Specialty Exam ? ?Presentation  ?General Appearance: Appropriate for Environment; Casual ? ?Eye Contact:Good ? ?Speech:Clear and Coherent ? ?Speech Volume:Normal ? ?Handedness:Right ? ? ?Mood and Affect  ?Mood:Euthymic ? ?Duration of Depression Symptoms: Greater than two weeks ? ?Affect:Appropriate; Congruent ? ? ?Thought Process  ?Thought Processes:Goal Directed ? ?Descriptions of Associations:Circumstantial ? ?Orientation:Full (Time, Place and Person) ? ?Thought Content:Logical ? ?History of Schizophrenia/Schizoaffective disorder:No ? ?Duration of Psychotic Symptoms:N/A ? ?Hallucinations:No data recorded ?Ideas of Reference:None ? ?Suicidal Thoughts:No data recorded ?Homicidal Thoughts:No data recorded ? ?Sensorium  ?Memory:Immediate Good; Recent Good ? ?Judgment:Fair ? ?Insight:Fair ? ? ?Executive Functions  ?Concentration:Good ? ?Attention Span:Good ? ?Recall:Good ? ?Fund of [place] ? ?Language:Good ? ? ?Psychomotor Activity  ?Psychomotor Activity:No data recorded ? ?Assets  ?Assets:Resilience; Armed forces logistics/support/administrative officer; Desire for Improvement; Social Support; Housing ? ? ?Sleep  ?Sleep:No data recorded ? ?Physical Exam: ?Physical Exam ?Vitals and nursing note reviewed.  ?Constitutional:   ?   Appearance: Normal appearance.  ?HENT:  ?   Head: Normocephalic and atraumatic.  ?   Mouth/Throat:  ?   Pharynx: Oropharynx is clear.  ?Eyes:  ?   Pupils: Pupils are equal, round, and reactive to light.  ?Cardiovascular:  ?   Rate and Rhythm: Normal rate and regular rhythm.  ?Pulmonary:  ?   Effort: Pulmonary effort  is normal.  ?   Breath sounds: Normal breath sounds.  ?Abdominal:  ?   General: Abdomen is flat.  ?   Palpations: Abdomen is soft.  ?Musculoskeletal:     ?   General: Normal range of motion.  ?Skin: ?   General: Skin is warm and dry.  ?Neurological:  ?   General: No focal deficit present.  ?   Mental Status: She is alert. Mental status is at baseline.  ?Psychiatric:     ?   Attention and Perception: Attention normal.     ?   Mood and Affect: Mood normal.     ?   Speech: Speech normal.     ?   Behavior: Behavior is slowed.     ?   Thought Content: Thought content normal. Thought content is not paranoid. Thought content does not include suicidal ideation.     ?   Cognition and Memory: Cognition normal.     ?   Judgment: Judgment normal.  ? ?Review of Systems  ?Constitutional: Negative.   ?HENT: Negative.    ?Eyes: Negative.   ?Respiratory: Negative.    ?Cardiovascular: Negative.   ?Gastrointestinal: Negative.   ?Musculoskeletal: Negative.   ?Skin: Negative.   ?Neurological: Negative.   ?Psychiatric/Behavioral:  Negative for depression, hallucinations, substance abuse and suicidal ideas. The patient is not nervous/anxious and does not have insomnia.   ?Blood pressure (!) 98/58, pulse 63, temperature 98.7 ?F (37.1 ?C), temperature source Oral, resp. rate 18, height '5\' 4"'$  (1.626 m), weight 74.8 kg, SpO2 97 %. Body mass index is 28.32 kg/m?. ? ?Mental Status Per Nursing Assessment::   ?On Admission:  NA ? ?Demographic Factors:  ?Caucasian ? ?Loss Factors: ?NA ? ?Historical Factors: ?Family history of mental illness or substance abuse ? ?Risk Reduction Factors:   ?Living with another person,  especially a relative, Positive social support, and Positive therapeutic relationship ? ?Continued Clinical Symptoms:  ?Bipolar Disorder:   Depressive phase ? ?Cognitive Features That Contribute To Risk:  ?None   ? ?Suicide Risk:  ?Minimal: No identifiable suicidal ideation.  Patients presenting with no risk factors but with morbid  ruminations; may be classified as minimal risk based on the severity of the depressive symptoms ? ? ? ?Plan Of Care/Follow-up recommendations:  ?Continue current medication and follow up with outpatient mental health team including psychiatry and therapy.  Follow up with [facility] with the guidance of your outpatient psychiatrist.  Patient is denying all suicidal thoughts and appears to be calm with good insight. ? ?Alethia Berthold, MD ?10/15/2021, 11:21 AM ?

## 2022-09-25 ENCOUNTER — Other Ambulatory Visit: Payer: Self-pay | Admitting: Internal Medicine

## 2022-09-25 DIAGNOSIS — Z1231 Encounter for screening mammogram for malignant neoplasm of breast: Secondary | ICD-10-CM

## 2022-09-25 DIAGNOSIS — N63 Unspecified lump in unspecified breast: Secondary | ICD-10-CM

## 2022-10-09 ENCOUNTER — Ambulatory Visit
Admission: RE | Admit: 2022-10-09 | Discharge: 2022-10-09 | Disposition: A | Discharge status: Home / Self Care | Payer: BC Managed Care – PPO | Source: Ambulatory Visit | Attending: Internal Medicine | Admitting: Internal Medicine

## 2022-10-09 DIAGNOSIS — N63 Unspecified lump in unspecified breast: Secondary | ICD-10-CM

## 2022-10-09 DIAGNOSIS — Z1231 Encounter for screening mammogram for malignant neoplasm of breast: Secondary | ICD-10-CM

## 2022-11-03 IMAGING — DX DG CHEST 1V PORT
1 series · 1 of 1 positions shown · non-contrast
Comparison: 05/29/2006

CLINICAL DATA: Respiratory failure.

EXAM:
PORTABLE CHEST 1 VIEW

[chest ap]
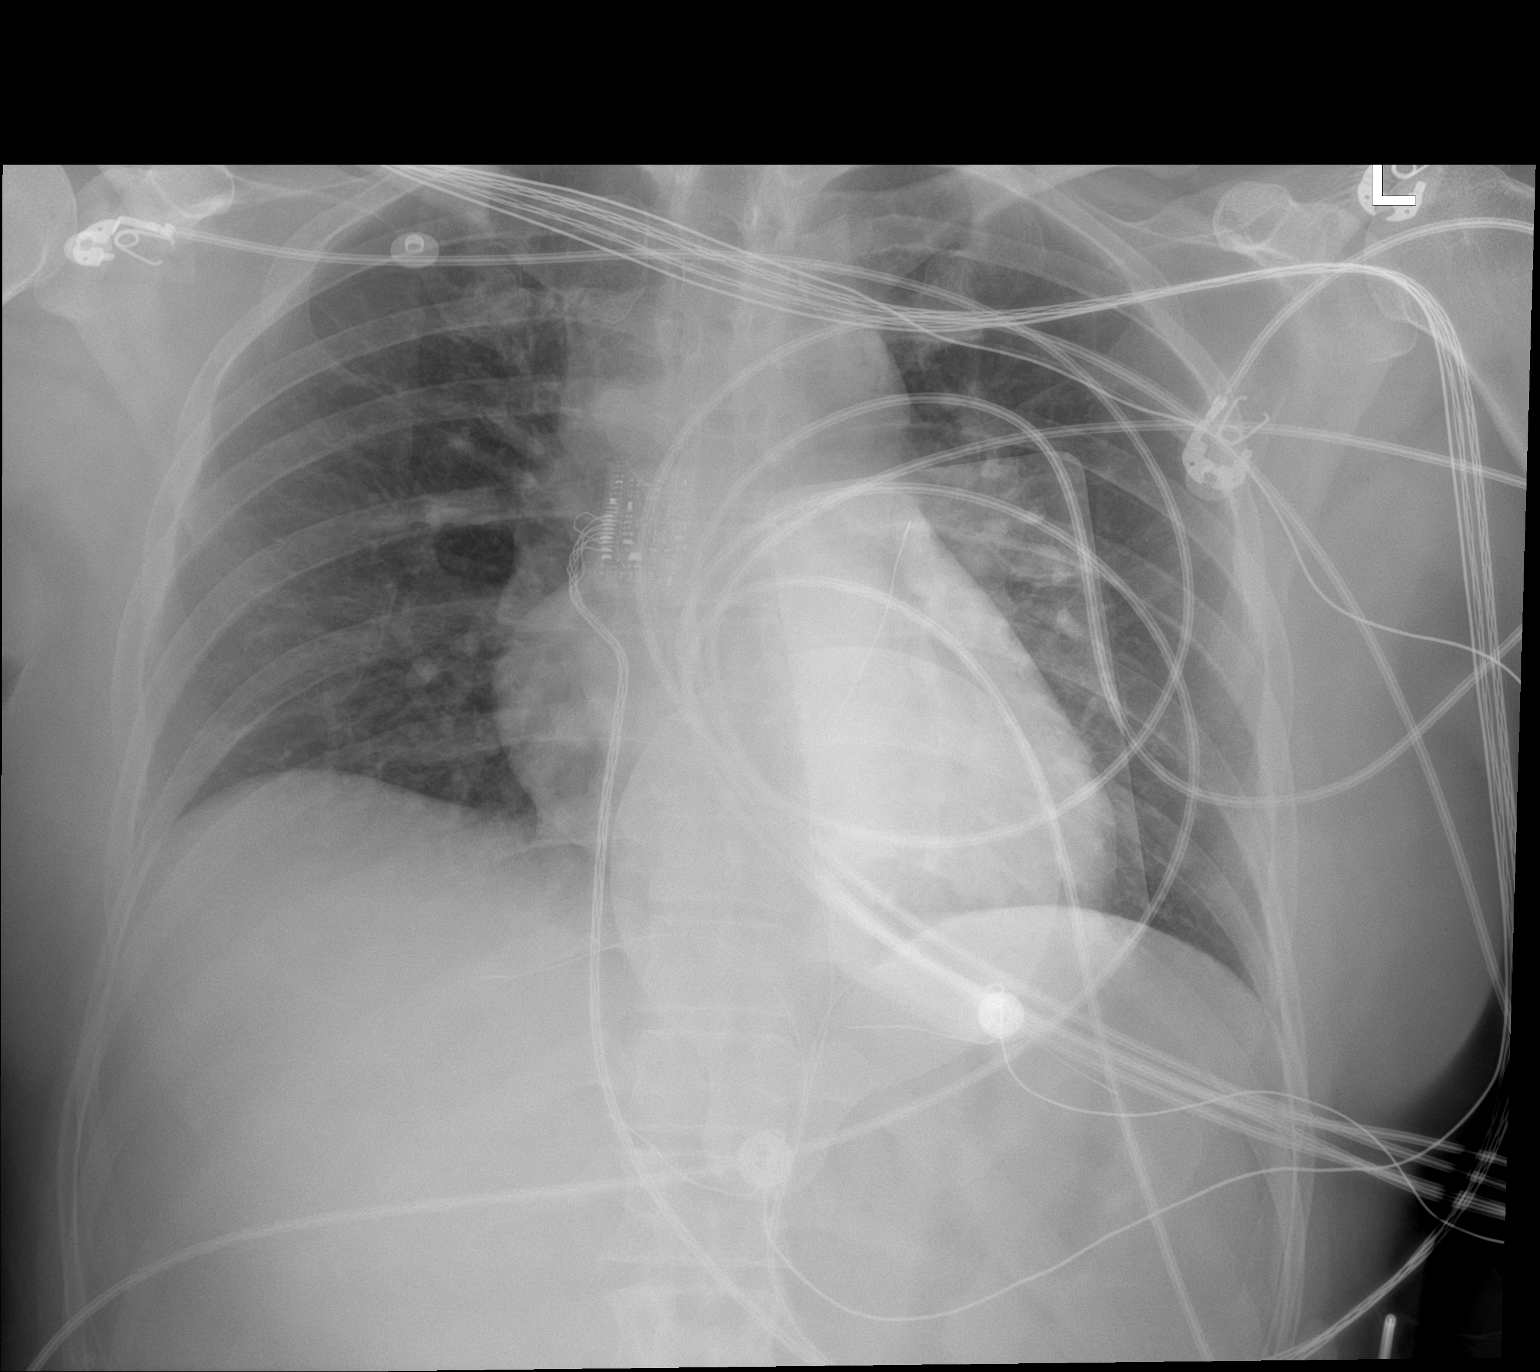

[1 of 1 positions shown; findings below may reference images not displayed]

FINDINGS: Low lung volumes are noted. Multiple lines and monitoring devices
are seen overlying the left hemithorax which limits evaluation. The
heart size and mediastinal contours are within normal limits. Both
lungs are clear.
IMPRESSION: No active disease.

## 2022-11-04 IMAGING — DX DG CHEST 1V PORT
1 series · 1 of 1 positions shown · non-contrast
Comparison: 10/05/2021

CLINICAL DATA: Drug overdose.  Respiratory failure.

EXAM:
PORTABLE CHEST 1 VIEW

[chest]
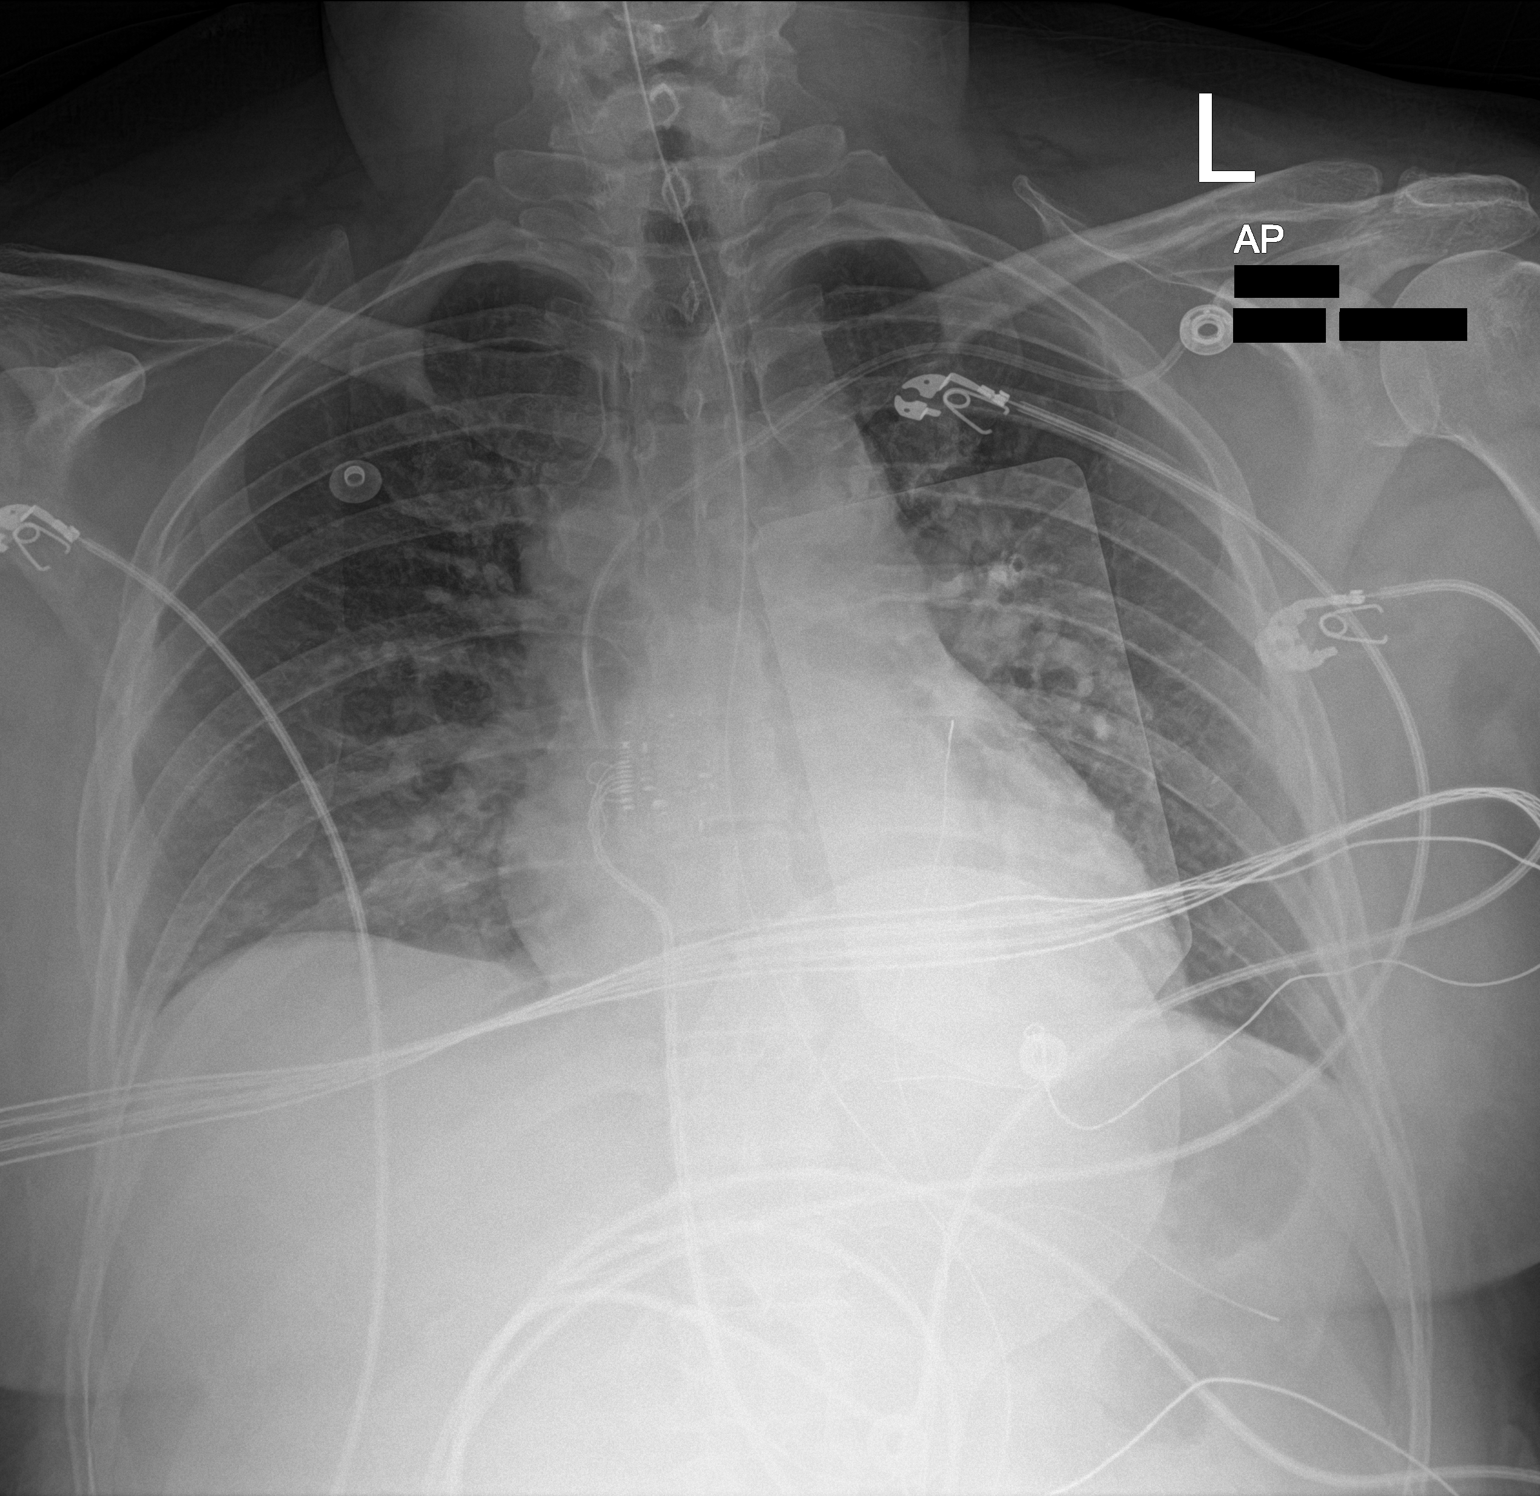

[1 of 1 positions shown; findings below may reference images not displayed]

FINDINGS: A left subclavian catheter terminates over the SVC, unchanged. An
enteric tube courses into the abdomen with tip not imaged. The
cardiomediastinal silhouette is unchanged. Defibrillator pads and
telemetry leads overlie the chest. Lung volumes remain low. New mild
perihilar and infrahilar opacities are present bilaterally. No
sizable pleural effusion or pneumothorax is identified.
IMPRESSION: New mild bilateral perihilar and infrahilar opacities, potentially
vascular congestion/early edema and atelectasis although aspiration
and infection are also possible.

## 2023-08-25 ENCOUNTER — Other Ambulatory Visit: Payer: Self-pay | Admitting: Internal Medicine

## 2023-08-25 DIAGNOSIS — Z1231 Encounter for screening mammogram for malignant neoplasm of breast: Secondary | ICD-10-CM
